# Patient Record
Sex: Female | Born: 1935 | Race: White | Hispanic: No | Marital: Single | Smoking: Never smoker
Health system: Southern US, Community
[De-identification: ages and names within clinical notes are randomized; demographics above are authoritative.]

## PROBLEM LIST (undated history)

## (undated) DIAGNOSIS — M810 Age-related osteoporosis without current pathological fracture: Secondary | ICD-10-CM

## (undated) DIAGNOSIS — M1990 Unspecified osteoarthritis, unspecified site: Secondary | ICD-10-CM

## (undated) DIAGNOSIS — F0390 Unspecified dementia without behavioral disturbance: Secondary | ICD-10-CM

## (undated) DIAGNOSIS — I10 Essential (primary) hypertension: Secondary | ICD-10-CM

## (undated) DIAGNOSIS — F329 Major depressive disorder, single episode, unspecified: Secondary | ICD-10-CM

## (undated) DIAGNOSIS — F32A Depression, unspecified: Secondary | ICD-10-CM

## (undated) DIAGNOSIS — I509 Heart failure, unspecified: Secondary | ICD-10-CM

## (undated) HISTORY — PX: CHOLECYSTECTOMY: SHX55

## (undated) HISTORY — PX: TOTAL HIP ARTHROPLASTY: SHX124

## (undated) HISTORY — PX: CATARACT EXTRACTION: SUR2

## (undated) HISTORY — PX: APPENDECTOMY: SHX54

---

## 2014-01-19 ENCOUNTER — Emergency Department: Payer: Self-pay | Admitting: Emergency Medicine

## 2014-01-19 LAB — CBC
HCT: 27.5 % — ABNORMAL LOW (ref 35.0–47.0)
HGB: 8.5 g/dL — ABNORMAL LOW (ref 12.0–16.0)
MCH: 24.4 pg — AB (ref 26.0–34.0)
MCHC: 30.8 g/dL — ABNORMAL LOW (ref 32.0–36.0)
MCV: 79 fL — ABNORMAL LOW (ref 80–100)
Platelet: 125 10*3/uL — ABNORMAL LOW (ref 150–440)
RBC: 3.48 10*6/uL — ABNORMAL LOW (ref 3.80–5.20)
RDW: 17.8 % — AB (ref 11.5–14.5)
WBC: 9.4 10*3/uL (ref 3.6–11.0)

## 2014-01-19 LAB — BASIC METABOLIC PANEL
Anion Gap: 3 — ABNORMAL LOW (ref 7–16)
BUN: 26 mg/dL — ABNORMAL HIGH (ref 7–18)
CALCIUM: 9.7 mg/dL (ref 8.5–10.1)
CHLORIDE: 112 mmol/L — AB (ref 98–107)
CREATININE: 1.21 mg/dL (ref 0.60–1.30)
Co2: 23 mmol/L (ref 21–32)
EGFR (African American): 46 — ABNORMAL LOW
GFR CALC NON AF AMER: 40 — AB
Glucose: 82 mg/dL (ref 65–99)
OSMOLALITY: 280 (ref 275–301)
Potassium: 5 mmol/L (ref 3.5–5.1)
SODIUM: 138 mmol/L (ref 136–145)

## 2014-01-19 LAB — TROPONIN I
Troponin-I: <0.02 ng/mL
Troponin-I: <0.02 ng/mL

## 2016-04-05 ENCOUNTER — Encounter: Payer: Self-pay | Admitting: Emergency Medicine

## 2016-04-05 ENCOUNTER — Emergency Department: Payer: Medicare Other

## 2016-04-05 ENCOUNTER — Emergency Department
Admission: EM | Admit: 2016-04-05 | Discharge: 2016-04-06 | Disposition: A | Discharge status: Home / Self Care | Payer: Medicare Other | Attending: Emergency Medicine | Admitting: Emergency Medicine

## 2016-04-05 DIAGNOSIS — I110 Hypertensive heart disease with heart failure: Secondary | ICD-10-CM | POA: Insufficient documentation

## 2016-04-05 DIAGNOSIS — K8689 Other specified diseases of pancreas: Secondary | ICD-10-CM | POA: Diagnosis not present

## 2016-04-05 DIAGNOSIS — K449 Diaphragmatic hernia without obstruction or gangrene: Secondary | ICD-10-CM

## 2016-04-05 DIAGNOSIS — R0602 Shortness of breath: Secondary | ICD-10-CM | POA: Diagnosis present

## 2016-04-05 DIAGNOSIS — K869 Disease of pancreas, unspecified: Secondary | ICD-10-CM

## 2016-04-05 DIAGNOSIS — I509 Heart failure, unspecified: Secondary | ICD-10-CM | POA: Insufficient documentation

## 2016-04-05 DIAGNOSIS — F329 Major depressive disorder, single episode, unspecified: Secondary | ICD-10-CM | POA: Diagnosis not present

## 2016-04-05 DIAGNOSIS — R0600 Dyspnea, unspecified: Secondary | ICD-10-CM | POA: Diagnosis not present

## 2016-04-05 DIAGNOSIS — M810 Age-related osteoporosis without current pathological fracture: Secondary | ICD-10-CM | POA: Insufficient documentation

## 2016-04-05 DIAGNOSIS — Z79899 Other long term (current) drug therapy: Secondary | ICD-10-CM | POA: Insufficient documentation

## 2016-04-05 HISTORY — DX: Unspecified dementia, unspecified severity, without behavioral disturbance, psychotic disturbance, mood disturbance, and anxiety: F03.90

## 2016-04-05 HISTORY — DX: Depression, unspecified: F32.A

## 2016-04-05 HISTORY — DX: Unspecified osteoarthritis, unspecified site: M19.90

## 2016-04-05 HISTORY — DX: Essential (primary) hypertension: I10

## 2016-04-05 HISTORY — DX: Heart failure, unspecified: I50.9

## 2016-04-05 HISTORY — DX: Age-related osteoporosis without current pathological fracture: M81.0

## 2016-04-05 HISTORY — DX: Major depressive disorder, single episode, unspecified: F32.9

## 2016-04-05 LAB — CBC WITH DIFFERENTIAL/PLATELET
BASOS ABS: 0.1 10*3/uL (ref 0–0.1)
BASOS PCT: 1 %
EOS ABS: 0.4 10*3/uL (ref 0–0.7)
Eosinophils Relative: 6 %
HCT: 38.4 % (ref 35.0–47.0)
HEMOGLOBIN: 12.6 g/dL (ref 12.0–16.0)
LYMPHS ABS: 1.7 10*3/uL (ref 1.0–3.6)
Lymphocytes Relative: 23 %
MCH: 31.4 pg (ref 26.0–34.0)
MCHC: 32.8 g/dL (ref 32.0–36.0)
MCV: 95.8 fL (ref 80.0–100.0)
Monocytes Absolute: 0.9 10*3/uL (ref 0.2–0.9)
Monocytes Relative: 12 %
NEUTROS PCT: 58 %
Neutro Abs: 4.2 10*3/uL (ref 1.4–6.5)
Platelets: 205 10*3/uL (ref 150–440)
RBC: 4.01 MIL/uL (ref 3.80–5.20)
RDW: 13.9 % (ref 11.5–14.5)
WBC: 7.3 10*3/uL (ref 3.6–11.0)

## 2016-04-05 LAB — URINALYSIS COMPLETE WITH MICROSCOPIC (ARMC ONLY)
BILIRUBIN URINE: NEGATIVE
GLUCOSE, UA: NEGATIVE mg/dL
HGB URINE DIPSTICK: NEGATIVE
Ketones, ur: NEGATIVE mg/dL
NITRITE: NEGATIVE
Protein, ur: NEGATIVE mg/dL
SPECIFIC GRAVITY, URINE: 1.019 (ref 1.005–1.030)
pH: 5 (ref 5.0–8.0)

## 2016-04-05 LAB — TROPONIN I

## 2016-04-05 LAB — BASIC METABOLIC PANEL
ANION GAP: 6 (ref 5–15)
BUN: 25 mg/dL — ABNORMAL HIGH (ref 6–20)
CHLORIDE: 108 mmol/L (ref 101–111)
CO2: 27 mmol/L (ref 22–32)
Calcium: 9.9 mg/dL (ref 8.9–10.3)
Creatinine, Ser: 1.22 mg/dL — ABNORMAL HIGH (ref 0.44–1.00)
GFR, EST AFRICAN AMERICAN: 44 mL/min — AB (ref >60)
GFR, EST NON AFRICAN AMERICAN: 38 mL/min — AB (ref >60)
Glucose, Bld: 95 mg/dL (ref 65–99)
POTASSIUM: 3.9 mmol/L (ref 3.5–5.1)
SODIUM: 141 mmol/L (ref 135–145)

## 2016-04-05 LAB — BRAIN NATRIURETIC PEPTIDE: B NATRIURETIC PEPTIDE 5: 312 pg/mL — AB (ref 0.0–100.0)

## 2016-04-05 MED ORDER — IOPAMIDOL (ISOVUE-300) INJECTION 61%
80.0000 mL | Freq: Once | INTRAVENOUS | Status: AC | PRN
  Administered 2016-04-05: 80 mL via INTRAVENOUS

## 2016-04-05 MED ORDER — DIATRIZOATE MEGLUMINE & SODIUM 66-10 % PO SOLN
15.0000 mL | Freq: Once | ORAL | Status: AC
  Administered 2016-04-05: 15 mL via ORAL

## 2016-04-05 MED ORDER — OMEPRAZOLE 40 MG PO CPDR: 40.0000 mg | DELAYED_RELEASE_CAPSULE | Freq: Every day | ORAL | Status: DC

## 2016-04-05 NOTE — ED Notes (Addendum)
[facility]. Staff stated she was wheezing and they gave her, her albuterol inhaler. Lungs clear on arrival. Patient c/o epigastric pain and back pain x4 days. Patient states, "it feels tight." Patient A&O x3, disoriented to time.

## 2016-04-05 NOTE — ED Notes (Signed)
Assisted pt to restroom. Pt walked to toilet with minimal assistance. Pt had loose BM. Reports has not had any loose BMs prior to today.

## 2016-04-05 NOTE — Discharge Instructions (Signed)
Hiatal Hernia °A hiatal hernia occurs when part of your stomach slides above the muscle that separates your abdomen from your chest (diaphragm). You can be born with a hiatal hernia (congenital), or it may develop over time. In almost all cases of hiatal hernia, only the top part of the stomach pushes through.  °Many people have a hiatal hernia with no symptoms. The larger the hernia, the more likely that you will have symptoms. In some cases, a hiatal hernia allows stomach acid to flow back into the tube that carries food from your mouth to your stomach (esophagus). This may cause heartburn symptoms. Severe heartburn symptoms may mean you have developed a condition called gastroesophageal reflux disease (GERD).  °CAUSES  °Hiatal hernias are caused by a weakness in the opening (hiatus) where your esophagus passes through your diaphragm to attach to the upper part of your stomach. You may be born with a weakness in your hiatus, or a weakness can develop. °RISK FACTORS °Older age is a major risk factor for a hiatal hernia. Anything that increases pressure on your diaphragm can also increase your risk of a hiatal hernia. This includes: °· Pregnancy. °· Excess weight. °· Frequent constipation. °SIGNS AND SYMPTOMS  °People with a hiatal hernia often have no symptoms. If symptoms develop, they are almost always caused by GERD. They may include: °· Heartburn. °· Belching. °· Indigestion. °· Trouble swallowing. °· Coughing or wheezing.  °· Sore throat. °· Hoarseness. °· Chest pain. °DIAGNOSIS  °A hiatal hernia is sometimes found during an exam for another problem. Your health care provider may suspect a hiatal hernia if you have symptoms of GERD. Tests may be done to diagnose GERD. These may include: °· X-rays of your stomach or chest. °· An upper gastrointestinal (GI) series. This is an X-ray exam of your GI tract involving the use of a chalky liquid that you swallow. The liquid shows up clearly on the X-ray. °· Endoscopy.  This is a procedure to look into your stomach using a thin, flexible tube that has a tiny camera and light on the end of it. °TREATMENT  °If you have no symptoms, you may not need treatment. If you have symptoms, treatment may include: °· Dietary and lifestyle changes to help reduce GERD symptoms. °· Medicines. These may include: °· Over-the-counter antacids. °· Medicines that make your stomach empty more quickly. °· Medicines that block the production of stomach acid (H2 blockers). °· Stronger medicines to reduce stomach acid (proton pump inhibitors). °· You may need surgery to repair the hernia if other treatments are not helping. °HOME CARE INSTRUCTIONS  °· Take all medicines as directed by your health care provider. °· Quit smoking, if you smoke. °· Try to achieve and maintain a healthy body weight. °· Eat frequent small meals instead of three large meals a day. This keeps your stomach from getting too full. °· Eat slowly. °· Do not lie down right after eating.  °· Do not eat 1-2 hours before bed.   °· Do not drink beverages with caffeine. These include cola, coffee, cocoa, and tea. °· Do not drink alcohol. °· Avoid foods that can make symptoms of GERD worse. These may include: °· Fatty foods. °· Citrus fruits. °· Other foods and drinks that contain acid. °· Avoid putting pressure on your belly. Anything that puts pressure on your belly increases the amount of acid that may be pushed up into your esophagus.   °· Avoid bending over, especially after eating. °· Raise the head of your bed   by putting blocks under the legs. This keeps your head and esophagus higher than your stomach. °· Do not wear tight clothing around your chest or stomach. °· Try not to strain when having a bowel movement, when urinating, or when lifting heavy objects. °SEEK MEDICAL CARE IF: °· Your symptoms are not controlled with medicines or lifestyle changes. °· You are having trouble swallowing. °· You have coughing or wheezing that will not  go away. °SEEK IMMEDIATE MEDICAL CARE IF: °· Your pain is getting worse. °· Your pain spreads to your arms, neck, jaw, teeth, or back. °· You have shortness of breath. °· You sweat for no reason. °· You feel sick to your stomach (nauseous) or vomit. °· You vomit blood. °· You have bright red blood in your stools. °· You have black, tarry stools.   °  °This information is not intended to replace advice given to you by your health care provider. Make sure you discuss any questions you have with your health care provider. °  °Document Released: 02/18/2004 Document Revised: 12/19/2014 Document Reviewed: 11/15/2013 °Elsevier Interactive Patient Education ©2016 [employer] ° °Shortness of Breath °Shortness of breath means you have trouble breathing. It could also mean that you have a medical problem. You should get immediate medical care for shortness of breath. °CAUSES  °· Not enough oxygen in the air such as with high altitudes or a smoke-filled room. °· Certain lung diseases, infections, or problems. °· Heart disease or conditions, such as angina or heart failure. °· Low red blood cells (anemia). °· Poor physical fitness, which can cause shortness of breath when you exercise. °· Chest or back injuries or stiffness. °· Being overweight. °· Smoking. °· Anxiety, which can make you feel like you are not getting enough air. °DIAGNOSIS  °Serious medical problems can often be found during your physical exam. Tests may also be done to determine why you are having shortness of breath. Tests may include: °· Chest X-rays. °· Lung function tests. °· Blood tests. °· An electrocardiogram (ECG). °· An ambulatory electrocardiogram. An ambulatory ECG records your heartbeat patterns over a 24-hour period. °· Exercise testing. °· A transthoracic echocardiogram (TTE). During echocardiography, sound waves are used to evaluate how blood flows through your heart. °· A transesophageal echocardiogram (TEE). °· Imaging scans. °Your health  care provider may not be able to find a cause for your shortness of breath after your exam. In this case, it is important to have a follow-up exam with your health care provider as directed.  °TREATMENT  °Treatment for shortness of breath depends on the cause of your symptoms and can vary greatly. °HOME CARE INSTRUCTIONS  °· Do not smoke. Smoking is a common cause of shortness of breath. If you smoke, ask for help to quit. °· Avoid being around chemicals or things that may bother your breathing, such as paint fumes and dust. °· Rest as needed. Slowly resume your usual activities. °· If medicines were prescribed, take them as directed for the full length of time directed. This includes oxygen and any inhaled medicines. °· Keep all follow-up appointments as directed by your health care provider. °SEEK MEDICAL CARE IF:  °· Your condition does not improve in the time expected. °· You have a hard time doing your normal activities even with rest. °· You have any new symptoms. °SEEK IMMEDIATE MEDICAL CARE IF:  °· Your shortness of breath gets worse. °· You feel light-headed, faint, or develop a cough not controlled with medicines. °· You   start coughing up blood. °· You have pain with breathing. °· You have chest pain or pain in your arms, shoulders, or abdomen. °· You have a fever. °· You are unable to walk up stairs or exercise the way you normally do. °MAKE SURE YOU: °· Understand these instructions. °· Will watch your condition. °· Will get help right away if you are not doing well or get worse. °  °This information is not intended to replace advice given to you by your health care provider. Make sure you discuss any questions you have with your health care provider. °  °Document Released: 08/23/2001 Document Revised: 12/03/2013 Document Reviewed: 02/13/2012 °Elsevier Interactive Patient Education ©2016 Elsevier Inc. ° °

## 2016-04-05 NOTE — ED Provider Notes (Signed)
[facility] Emergency Department Provider Note     Time seen: ----------------------------------------- 7:58 PM on 04/05/2016 -----------------------------------------    I have reviewed the triage vital signs and the nursing notes.   HISTORY  Chief Complaint Shortness of Breath    HPI Crystal Barrera is a 82 y.o. female who presents from the nursing home for shortness of breath and abdominal pain. Staff states she was wheezing and gave her an albuterol inhaler. She rales with clear lungs but complains of epigastric pain and back pain for last 4 days. Patient states just feels tight, nothing seems to make it better or worse. Son states she has not had a history of that, she does have a when necessary inhaler to use at the nursing home.   Past Medical History  Diagnosis Date  . Dementia   . Arthritis   . Osteoporosis   . Depression   . Hypertension     There are no active problems to display for this patient.   Past Surgical History  Procedure Laterality Date  . Total hip arthroplasty    . Cholecystectomy    . Cataract extraction    . Appendectomy      Allergies Review of patient's allergies indicates no known allergies.  Social History Social History  Substance Use Topics  . Smoking status: Never Smoker   . Smokeless tobacco: None  . Alcohol Use: No    Review of Systems Constitutional: Negative for fever. Eyes: Negative for visual changes. ENT: Negative for sore throat. Cardiovascular: Negative for chest pain. Respiratory: Positive for shortness of breath Gastrointestinal: Positive for abdominal pain, negative for vomiting and diarrhea Genitourinary: Negative for dysuria. Musculoskeletal: Positive for back pain Skin: Negative for rash. Neurological: Negative for headaches, focal weakness or numbness.  10-point ROS otherwise negative.  ____________________________________________   PHYSICAL EXAM:  VITAL SIGNS: ED Triage  Vitals  Enc Vitals Group     BP 04/05/16 1952 192/66 mmHg     Pulse Rate 04/05/16 1952 65     Resp 04/05/16 1952 17     Temp 04/05/16 1952 97.5 F (36.4 C)     Temp src --      SpO2 04/05/16 1952 97 %     Weight 04/05/16 1952 130 lb (58.968 kg)     Height 04/05/16 1952  (1.6 m)     Head Cir --      Peak Flow --      Pain Score --      Pain Loc --      Pain Edu? --      Excl. in GC? --    Constitutional: Alert, Well appearing and in no distress. Eyes: Conjunctivae are normal. PERRL. Normal extraocular movements. ENT   Head: Normocephalic and atraumatic.   Nose: No congestion/rhinnorhea.   Mouth/Throat: Mucous membranes are moist.   Neck: No stridor. Cardiovascular: Normal rate, regular rhythm. No murmurs, rubs, or gallops. Respiratory: Normal respiratory effort without tachypnea nor retractions. Breath sounds are clear and equal bilaterally. Minimal end expiratory wheezing Gastrointestinal: Epigastric tenderness, no rebound or guarding. Normal bowel sounds. Musculoskeletal: Nontender with normal range of motion in all extremities. No lower extremity tenderness nor edema. Neurologic:  Normal speech and language. No gross focal neurologic deficits are appreciated.  Skin:  Skin is warm, dry and intact. No rash noted. Psychiatric: Mood and affect are normal. Speech and behavior are normal.  ____________________________________________  EKG: Interpreted by me. Sinus rhythm with a rate of 57 bpm, normal  PR interval, normal QRS, normal QT interval. Normal axis.  ____________________________________________  ED COURSE:  Pertinent labs & imaging results that were available during my care of the patient were reviewed by me and considered in my medical decision making (see chart for details). Patient is in no acute distress, will check basic labs and imaging. ____________________________________________    LABS (pertinent positives/negatives)  Labs Reviewed  BASIC  METABOLIC PANEL - Abnormal; Notable for the following:    BUN 25 (*)    Creatinine, Ser 1.22 (*)    GFR calc non Af Amer 38 (*)    GFR calc Af Amer 44 (*)    All other components within normal limits  BRAIN NATRIURETIC PEPTIDE - Abnormal; Notable for the following:    B Natriuretic Peptide 312.0 (*)    All other components within normal limits  URINALYSIS COMPLETEWITH MICROSCOPIC ([facility] ONLY) - Abnormal; Notable for the following:    Color, Urine YELLOW (*)    APPearance CLEAR (*)    Leukocytes, UA TRACE (*)    Bacteria, UA RARE (*)    Squamous Epithelial / LPF 0-5 (*)    All other components within normal limits  CBC WITH DIFFERENTIAL/PLATELET  TROPONIN I  HEPATIC FUNCTION PANEL  LIPASE, BLOOD    RADIOLOGY Images were viewed by me  Chest x-ray, CT abdomen and pelvis CLINICAL DATA: Dyspnea. Diffuse abdominal pain. Epigastric and back pain for 4 days.  EXAM: PORTABLE CHEST 1 VIEW  COMPARISON: Chest radiograph 01/19/2014  FINDINGS: Cardiomegaly and mediastinal contours are unchanged. Atherosclerosis of the aortic arch. Questionable retrocardiac hiatal hernia. No pulmonary edema, confluent airspace disease, large pleural effusion or pneumothorax. The bones appear under mineralized.  IMPRESSION: 1. Unchanged cardiomegaly. No congestive failure. 2. Questionable retrocardiac hiatal hernia.  IMPRESSION: 1. Heterogeneous density of the pancreatic head, unclear whether this represents fatty infiltration, atrophy, or underlying pancreatic lesion. There is no peripancreatic inflammation or ductal dilatation. MRCP could be considered for evaluation, only when patient is able tolerate breath hold technique. 2. Moderate to large hiatal hernia, greater than 50% of the stomach is intrathoracic. No volvulus or complication. 3. Diffuse colonic diverticulosis, no diverticulitis. 4. Scarring and atrophy of the upper left  kidney. ____________________________________________  FINAL ASSESSMENT AND PLAN  Dyspnea, abdominal pain, pancreatic head lesion, hiatal hernia  Plan: Patient with labs and imaging as dictated above. Patient resents to ER with some dyspnea which has resolved and some abdominal pain which has resolved. She appears to possibly have a pancreatic head lesion and she'll be referred to GI for follow-up. It is unclear if this will be addressed or not given her age. I will ensure she is on antacids and she is stable for outpatient follow-up.   Emily FilbertWilliams, Jonathan E, MD   Emily FilbertJonathan E Williams, MD 04/05/16 [phone]

## 2016-04-06 LAB — HEPATIC FUNCTION PANEL
ALK PHOS: 94 U/L (ref 38–126)
ALT: 11 U/L — AB (ref 14–54)
AST: 19 U/L (ref 15–41)
Albumin: 3.7 g/dL (ref 3.5–5.0)
BILIRUBIN TOTAL: 0.3 mg/dL (ref 0.3–1.2)
Bilirubin, Direct: <0.1 mg/dL — ABNORMAL LOW (ref 0.1–0.5)
Total Protein: 7.1 g/dL (ref 6.5–8.1)

## 2016-04-06 LAB — LIPASE, BLOOD: Lipase: 53 U/L — ABNORMAL HIGH (ref 11–51)

## 2017-02-25 ENCOUNTER — Emergency Department
Admission: EM | Admit: 2017-02-25 | Discharge: 2017-02-25 | Disposition: A | Discharge status: Home / Self Care | Payer: Medicare Other | Attending: Emergency Medicine | Admitting: Emergency Medicine

## 2017-02-25 ENCOUNTER — Emergency Department: Payer: Medicare Other

## 2017-02-25 DIAGNOSIS — Y929 Unspecified place or not applicable: Secondary | ICD-10-CM | POA: Diagnosis not present

## 2017-02-25 DIAGNOSIS — Z79899 Other long term (current) drug therapy: Secondary | ICD-10-CM | POA: Insufficient documentation

## 2017-02-25 DIAGNOSIS — M79604 Pain in right leg: Secondary | ICD-10-CM | POA: Diagnosis not present

## 2017-02-25 DIAGNOSIS — I509 Heart failure, unspecified: Secondary | ICD-10-CM | POA: Diagnosis not present

## 2017-02-25 DIAGNOSIS — I110 Hypertensive heart disease with heart failure: Secondary | ICD-10-CM | POA: Insufficient documentation

## 2017-02-25 DIAGNOSIS — K625 Hemorrhage of anus and rectum: Secondary | ICD-10-CM | POA: Insufficient documentation

## 2017-02-25 DIAGNOSIS — Y999 Unspecified external cause status: Secondary | ICD-10-CM | POA: Insufficient documentation

## 2017-02-25 DIAGNOSIS — S8991XA Unspecified injury of right lower leg, initial encounter: Secondary | ICD-10-CM | POA: Diagnosis present

## 2017-02-25 DIAGNOSIS — Y939 Activity, unspecified: Secondary | ICD-10-CM | POA: Diagnosis not present

## 2017-02-25 DIAGNOSIS — W1839XA Other fall on same level, initial encounter: Secondary | ICD-10-CM | POA: Insufficient documentation

## 2017-02-25 DIAGNOSIS — F329 Major depressive disorder, single episode, unspecified: Secondary | ICD-10-CM | POA: Diagnosis not present

## 2017-02-25 LAB — CBC WITH DIFFERENTIAL/PLATELET
Basophils Absolute: 0.1 10*3/uL (ref 0–0.1)
Basophils Relative: 2 %
Eosinophils Absolute: 0.2 10*3/uL (ref 0–0.7)
Eosinophils Relative: 3 %
HCT: 41 % (ref 35.0–47.0)
HEMOGLOBIN: 13.6 g/dL (ref 12.0–16.0)
LYMPHS PCT: 25 %
Lymphs Abs: 1.7 10*3/uL (ref 1.0–3.6)
MCH: 32.3 pg (ref 26.0–34.0)
MCHC: 33.1 g/dL (ref 32.0–36.0)
MCV: 97.4 fL (ref 80.0–100.0)
Monocytes Absolute: 0.7 10*3/uL (ref 0.2–0.9)
Monocytes Relative: 10 %
NEUTROS PCT: 60 %
Neutro Abs: 4.2 10*3/uL (ref 1.4–6.5)
Platelets: 225 10*3/uL (ref 150–440)
RBC: 4.21 MIL/uL (ref 3.80–5.20)
RDW: 15.1 % — ABNORMAL HIGH (ref 11.5–14.5)
WBC: 6.9 10*3/uL (ref 3.6–11.0)

## 2017-02-25 NOTE — ED Notes (Signed)

## 2017-02-25 NOTE — ED Triage Notes (Signed)
[facility]. Per EMS pt fell 3 days ago, and had been c/o generalized body pain especially right leg pain. Pt alert and in no acute distress at this time.

## 2017-02-25 NOTE — ED Notes (Signed)
Attempted IV placement x2 by this RN and x2 by 2nd RN. All attempts unsuccessful. Blood work obtained and sent to lab.

## 2017-02-25 NOTE — ED Notes (Signed)
Per family member, pt noted to have bright red bleeding in stool and on pad. Assisted PA with rectal exam. No hemorrhoids noted per PA. HEMOCCULT POSITIVE. Pt tolerated well.

## 2017-02-25 NOTE — ED Provider Notes (Signed)
[facility] Emergency Department Provider Note   ____________________________________________   None    (approximate)  I have reviewed the triage vital signs and the nursing notes.   HISTORY  Chief Complaint Fall    HPI Crystal Barrera is a 83 y.o. female patient complain of right upper and lower extremity pain secondary to a fall 3 days ago. Patient stated fall from a standing position but she just lost balance when turning. Patient denies LOC or head injury. Patient mental condition and hearing loss limited getting  a good history. Patient arrived via EMS from nursing home.  Past Medical History:  Diagnosis Date  . Arthritis   . CHF (congestive heart failure) (HCC)   . Dementia   . Depression   . Hypertension   . Osteoporosis     There are no active problems to display for this patient.   Past Surgical History:  Procedure Laterality Date  . APPENDECTOMY    . CATARACT EXTRACTION    . CHOLECYSTECTOMY    . TOTAL HIP ARTHROPLASTY      Prior to Admission medications   Medication Sig Start Date End Date Taking? Authorizing Provider  acetaminophen (TYLENOL) 325 MG tablet Take 650 mg by mouth 3 (three) times daily.    Historical Provider, MD  albuterol (PROVENTIL HFA;VENTOLIN HFA) 108 (90 Base) MCG/ACT inhaler Inhale 1 puff into the lungs 2 (two) times daily. Pt also uses twice daily as needed for shortness of breath/wheezing.    Historical Provider, MD  amLODipine (NORVASC) 5 MG tablet Take 5 mg by mouth daily.    Historical Provider, MD  Calcium Citrate-Vitamin D 315-250 MG-UNIT TABS Take 2 tablets by mouth daily.    Historical Provider, MD  donepezil (ARICEPT) 10 MG tablet Take 10 mg by mouth at bedtime.    Historical Provider, MD  fluticasone (FLONASE) 50 MCG/ACT nasal spray Place 2 sprays into both nostrils daily.    Historical Provider, MD  furosemide (LASIX) 20 MG tablet Take 20 mg by mouth daily.    Historical Provider, MD  gabapentin  (NEURONTIN) 100 MG capsule Take 100 mg by mouth at bedtime.    Historical Provider, MD  glucosamine-chondroitin 500-400 MG tablet Take 1 tablet by mouth daily.    Historical Provider, MD  hydroxypropyl methylcellulose / hypromellose (ISOPTO TEARS / GONIOVISC) 2.5 % ophthalmic solution Place 2 drops into both eyes 4 (four) times daily.    Historical Provider, MD  iron polysaccharides (NIFEREX) 150 MG capsule Take 150 mg by mouth 3 (three) times daily.    Historical Provider, MD  Melatonin 5 MG TABS Take 5 mg by mouth at bedtime.    Historical Provider, MD  memantine (NAMENDA) 10 MG tablet Take 10 mg by mouth 2 (two) times daily.    Historical Provider, MD  Menthol, Topical Analgesic, (ICY HOT EX) Apply 1 application topically 2 (two) times daily. Pt applies to shoulders and knees.    Historical Provider, MD  metoprolol tartrate (LOPRESSOR) 25 MG tablet Take 25 mg by mouth 2 (two) times daily.    Historical Provider, MD  nitroGLYCERIN (NITROSTAT) 0.4 MG SL tablet Place 0.4 mg under the tongue every 5 (five) minutes as needed for chest pain.    Historical Provider, MD  omega-3 acid ethyl esters (LOVAZA) 1 g capsule Take 1 g by mouth daily.    Historical Provider, MD  omeprazole (PRILOSEC) 40 MG capsule Take 1 capsule (40 mg total) by mouth daily. 04/05/16 04/05/17  Emily Filbert,  MD  pravastatin (PRAVACHOL) 10 MG tablet Take 10 mg by mouth daily.    Historical Provider, MD  sertraline (ZOLOFT) 50 MG tablet Take 50 mg by mouth daily.    Historical Provider, MD  traMADol (ULTRAM) 50 MG tablet Take 50 mg by mouth 2 (two) times daily.    Historical Provider, MD    Allergies Patient has no known allergies.  Family History  Problem Relation Age of Onset  . Family history unknown: Yes    Social History Social History  Substance Use Topics  . Smoking status: Never Smoker  . Smokeless tobacco: Never Used  . Alcohol use No    Review of Systems Constitutional: No fever/chills Eyes: No visual  changes. ENT: No sore throat. Cardiovascular: Denies chest pain. Respiratory: Denies shortness of breath. Gastrointestinal: No abdominal pain.  No nausea, no vomiting.  No diarrhea.  No constipation. Genitourinary: Negative for dysuria. Musculoskeletal:Right lower extremity pain Skin: Negative for rash. Neurological: Negative for headaches, focal weakness or numbness. Psychiatric:Dementia and depression Endocrine:Hypertension   ____________________________________________   PHYSICAL EXAM:  VITAL SIGNS: ED Triage Vitals  Enc Vitals Group     BP 02/25/17 1151 (!) 180/54     Pulse Rate 02/25/17 1151 65     Resp 02/25/17 1151 18     Temp 02/25/17 1151 97.5 F (36.4 C)     Temp Source 02/25/17 1151 Oral     SpO2 02/25/17 1151 100 %     Weight 02/25/17 1152 127 lb 8 oz (57.8 kg)     Height 02/25/17 1152 5' (1.524 m)     Head Circumference --      Peak Flow --      Pain Score --      Pain Loc --      Pain Edu? --      Excl. in GC? --     Constitutional: Alert and oriented. Well appearing and in no acute distress. Eyes: Conjunctivae are normal. PERRL. EOMI. Head: Atraumatic. Nose: No congestion/rhinnorhea. Mouth/Throat: Mucous membranes are moist.  Oropharynx non-erythematous. Neck: No stridor.  No cervical spine tenderness to palpation. Hematological/Lymphatic/Immunilogical: No cervical lymphadenopathy. Cardiovascular: Normal rate, regular rhythm. Grossly normal heart sounds.  Good peripheral circulation. Elevated blood pressure Respiratory: Normal respiratory effort.  No retractions. Lungs CTAB. Gastrointestinal: Soft and nontender. No distention. No abdominal bruits. No CVA tenderness. Musculoskeletal: No obvious lower extremity deformity. No leg length discrepancy. Patient has diffuse guarding from the ankle to the right hip. Neurologic:  Normal speech and language. No gross focal neurologic deficits are appreciated. No gait instability. Skin:  Skin is warm, dry and  intact. No rash noted. Psychiatric: Mood and affect are normal. Speech and behavior are normal.  ____________________________________________   LABS (all labs ordered are listed, but only abnormal results are displayed)  Labs Reviewed  CBC WITH DIFFERENTIAL/PLATELET - Abnormal; Notable for the following:       Result Value   RDW 15.1 (*)    All other components within normal limits  BASIC METABOLIC PANEL  BASIC METABOLIC PANEL  TYPE AND SCREEN  TYPE AND SCREEN   ____________________________________________  EKG   ____________________________________________  RADIOLOGY  X-ray of the right hip, right tib-fib, and right ankle reveals no fracture or other acute findings. ____________________________________________   PROCEDURES  Procedure(s) performed: None  Procedures  Critical Care performed: No  ____________________________________________   INITIAL IMPRESSION / ASSESSMENT AND PLAN / ED COURSE  Pertinent labs & imaging results that were available during my care of  the patient were reviewed by me and considered in my medical decision making (see chart for details).  Musculoskeletal pain secondary to fall. Discussed x-ray findings with patient. He has been brought to the attention of the patient went to the bathroom to urinate and was found to be blood on her pad. Patient underwent a rectal exam which showed a she is guaiac positive. No palpable stool or masses noted. No noticeable hemorrhaging. Patient labs unremarkable. Patient advised via CNA to monitor condition return back immediately if there is bright red blood in the follow-up with her treating doctor in 2 days.    ____________________________________________   FINAL CLINICAL IMPRESSION(S) / ED DIAGNOSES  Final diagnoses:  Musculoskeletal pain of lower extremity, right  Rectal bleeding      NEW MEDICATIONS STARTED DURING THIS VISIT:  New Prescriptions   No medications on file     Note:  This  document was prepared using Dragon voice recognition software and may include unintentional dictation errors.    Joni Reiningonald K Raza Bayless, PA-C 02/25/17 1251    Joni Reiningonald K Maxene Byington, PA-C 02/25/17 1418    Joni Reiningonald K Cregg Jutte, PA-C 02/25/17 1419    Myrna Blazeravid Matthew Schaevitz, MD 02/25/17 [phone]

## 2017-02-25 NOTE — ED Notes (Signed)
Per PA, okay to forgo re-drawing BMP/Type & Screen d/t pt is difficult stick and is up for discharge at this time.

## 2017-02-25 NOTE — ED Notes (Signed)
Patient transported to X-ray 

## 2017-03-04 ENCOUNTER — Inpatient Hospital Stay
Admission: EM | Admit: 2017-03-04 | Discharge: 2017-03-06 | DRG: 394 | Disposition: A | Discharge status: Hospice | Payer: Medicare Other | Attending: Internal Medicine | Admitting: Internal Medicine

## 2017-03-04 ENCOUNTER — Emergency Department: Payer: Medicare Other

## 2017-03-04 DIAGNOSIS — Z66 Do not resuscitate: Secondary | ICD-10-CM | POA: Diagnosis present

## 2017-03-04 DIAGNOSIS — N390 Urinary tract infection, site not specified: Secondary | ICD-10-CM | POA: Diagnosis present

## 2017-03-04 DIAGNOSIS — K631 Perforation of intestine (nontraumatic): Principal | ICD-10-CM | POA: Diagnosis present

## 2017-03-04 DIAGNOSIS — Z515 Encounter for palliative care: Secondary | ICD-10-CM | POA: Diagnosis present

## 2017-03-04 DIAGNOSIS — I130 Hypertensive heart and chronic kidney disease with heart failure and stage 1 through stage 4 chronic kidney disease, or unspecified chronic kidney disease: Secondary | ICD-10-CM | POA: Diagnosis present

## 2017-03-04 DIAGNOSIS — Z9049 Acquired absence of other specified parts of digestive tract: Secondary | ICD-10-CM

## 2017-03-04 DIAGNOSIS — Z791 Long term (current) use of non-steroidal anti-inflammatories (NSAID): Secondary | ICD-10-CM

## 2017-03-04 DIAGNOSIS — N179 Acute kidney failure, unspecified: Secondary | ICD-10-CM | POA: Diagnosis present

## 2017-03-04 DIAGNOSIS — F0390 Unspecified dementia without behavioral disturbance: Secondary | ICD-10-CM | POA: Diagnosis present

## 2017-03-04 DIAGNOSIS — M1990 Unspecified osteoarthritis, unspecified site: Secondary | ICD-10-CM | POA: Diagnosis present

## 2017-03-04 DIAGNOSIS — N185 Chronic kidney disease, stage 5: Secondary | ICD-10-CM | POA: Diagnosis present

## 2017-03-04 DIAGNOSIS — I509 Heart failure, unspecified: Secondary | ICD-10-CM | POA: Diagnosis present

## 2017-03-04 DIAGNOSIS — Z7189 Other specified counseling: Secondary | ICD-10-CM | POA: Diagnosis not present

## 2017-03-04 DIAGNOSIS — M810 Age-related osteoporosis without current pathological fracture: Secondary | ICD-10-CM | POA: Diagnosis present

## 2017-03-04 DIAGNOSIS — Z79899 Other long term (current) drug therapy: Secondary | ICD-10-CM | POA: Diagnosis not present

## 2017-03-04 DIAGNOSIS — F329 Major depressive disorder, single episode, unspecified: Secondary | ICD-10-CM | POA: Diagnosis present

## 2017-03-04 DIAGNOSIS — Z7951 Long term (current) use of inhaled steroids: Secondary | ICD-10-CM | POA: Diagnosis not present

## 2017-03-04 DIAGNOSIS — Z96649 Presence of unspecified artificial hip joint: Secondary | ICD-10-CM | POA: Diagnosis present

## 2017-03-04 LAB — URINALYSIS, COMPLETE (UACMP) WITH MICROSCOPIC
BILIRUBIN URINE: NEGATIVE
GLUCOSE, UA: NEGATIVE mg/dL
Ketones, ur: NEGATIVE mg/dL
NITRITE: POSITIVE — AB
PROTEIN: NEGATIVE mg/dL
Specific Gravity, Urine: 1.019 (ref 1.005–1.030)
pH: 5 (ref 5.0–8.0)

## 2017-03-04 LAB — BASIC METABOLIC PANEL
Anion gap: 9 (ref 5–15)
BUN: 59 mg/dL — AB (ref 6–20)
CHLORIDE: 106 mmol/L (ref 101–111)
CO2: 21 mmol/L — AB (ref 22–32)
CREATININE: 2.53 mg/dL — AB (ref 0.44–1.00)
Calcium: 10.1 mg/dL (ref 8.9–10.3)
GFR calc Af Amer: 18 mL/min — ABNORMAL LOW (ref >60)
GFR calc non Af Amer: 15 mL/min — ABNORMAL LOW (ref >60)
GLUCOSE: 120 mg/dL — AB (ref 65–99)
POTASSIUM: 4.4 mmol/L (ref 3.5–5.1)
SODIUM: 136 mmol/L (ref 135–145)

## 2017-03-04 LAB — CBC
HCT: 37.8 % (ref 35.0–47.0)
HEMOGLOBIN: 12.3 g/dL (ref 12.0–16.0)
MCH: 31.9 pg (ref 26.0–34.0)
MCHC: 32.5 g/dL (ref 32.0–36.0)
MCV: 98.3 fL (ref 80.0–100.0)
Platelets: 209 10*3/uL (ref 150–440)
RBC: 3.84 MIL/uL (ref 3.80–5.20)
RDW: 14.3 % (ref 11.5–14.5)
WBC: 23.2 10*3/uL — ABNORMAL HIGH (ref 3.6–11.0)

## 2017-03-04 LAB — DIFFERENTIAL
Basophils Absolute: 0 10*3/uL (ref 0–0.1)
Basophils Relative: 0 %
EOS PCT: 0 %
Eosinophils Absolute: 0 10*3/uL (ref 0–0.7)
LYMPHS ABS: 1 10*3/uL (ref 1.0–3.6)
LYMPHS PCT: 4 %
MONO ABS: 1.2 10*3/uL — AB (ref 0.2–0.9)
Monocytes Relative: 5 %
NEUTROS ABS: 20.9 10*3/uL — AB (ref 1.4–6.5)
Neutrophils Relative %: 91 %

## 2017-03-04 LAB — BRAIN NATRIURETIC PEPTIDE: B Natriuretic Peptide: 350 pg/mL — ABNORMAL HIGH (ref 0.0–100.0)

## 2017-03-04 LAB — INFLUENZA PANEL BY PCR (TYPE A & B)
INFLAPCR: NEGATIVE
Influenza B By PCR: NEGATIVE

## 2017-03-04 LAB — ALBUMIN: Albumin: 3.3 g/dL — ABNORMAL LOW (ref 3.5–5.0)

## 2017-03-04 LAB — LIPASE, BLOOD: LIPASE: 12 U/L (ref 11–51)

## 2017-03-04 LAB — PROTEIN, TOTAL: Total Protein: 6.9 g/dL (ref 6.5–8.1)

## 2017-03-04 LAB — AST: AST: 19 U/L (ref 15–41)

## 2017-03-04 LAB — TROPONIN I: Troponin I: 0.04 ng/mL (ref <0.03)

## 2017-03-04 LAB — ALKALINE PHOSPHATASE: Alkaline Phosphatase: 92 U/L (ref 38–126)

## 2017-03-04 LAB — ALT: ALT: 10 U/L — ABNORMAL LOW (ref 14–54)

## 2017-03-04 LAB — BILIRUBIN, TOTAL: Total Bilirubin: 1 mg/dL (ref 0.3–1.2)

## 2017-03-04 LAB — LACTIC ACID, PLASMA: Lactic Acid, Venous: 1.6 mmol/L (ref 0.5–1.9)

## 2017-03-04 MED ORDER — LORAZEPAM 1 MG PO TABS: 1.0000 mg | ORAL_TABLET | ORAL | Status: DC | PRN

## 2017-03-04 MED ORDER — ACETAMINOPHEN 325 MG PO TABS: 650.0000 mg | ORAL_TABLET | Freq: Four times a day (QID) | ORAL | Status: DC | PRN

## 2017-03-04 MED ORDER — PIPERACILLIN-TAZOBACTAM 3.375 G IVPB 30 MIN
3.3750 g | Freq: Once | INTRAVENOUS | Status: DC
  Filled 2017-03-04: qty 50

## 2017-03-04 MED ORDER — GLYCOPYRROLATE 1 MG PO TABS
1.0000 mg | ORAL_TABLET | ORAL | Status: DC | PRN
Start: 2017-03-04 — End: 2017-03-06

## 2017-03-04 MED ORDER — MORPHINE SULFATE (CONCENTRATE) 10 MG/0.5ML PO SOLN
5.0000 mg | ORAL | Status: DC | PRN
  Administered 2017-03-05 – 2017-03-06 (×3): 5 mg via ORAL
  Filled 2017-03-04 (×3): qty 1

## 2017-03-04 MED ORDER — GLYCOPYRROLATE 0.2 MG/ML IJ SOLN
0.2000 mg | INTRAMUSCULAR | Status: DC | PRN
Start: 2017-03-04 — End: 2017-03-06
  Filled 2017-03-04: qty 1

## 2017-03-04 MED ORDER — LORAZEPAM 2 MG/ML PO CONC
1.0000 mg | ORAL | Status: DC | PRN
  Administered 2017-03-05: 08:00:00 1 mg via SUBLINGUAL
  Filled 2017-03-04: qty 1

## 2017-03-04 MED ORDER — ONDANSETRON HCL 4 MG/2ML IJ SOLN: 4.0000 mg | Freq: Four times a day (QID) | INTRAMUSCULAR | Status: DC | PRN

## 2017-03-04 MED ORDER — ONDANSETRON 4 MG PO TBDP
4.0000 mg | ORAL_TABLET | Freq: Four times a day (QID) | ORAL | Status: DC | PRN
  Filled 2017-03-04: qty 1

## 2017-03-04 MED ORDER — BIOTENE DRY MOUTH MT LIQD: 15.0000 mL | OROMUCOSAL | Status: DC | PRN

## 2017-03-04 MED ORDER — SODIUM CHLORIDE 0.9 % IV BOLUS (SEPSIS): 1000.0000 mL | Freq: Once | INTRAVENOUS | Status: DC

## 2017-03-04 MED ORDER — POLYVINYL ALCOHOL 1.4 % OP SOLN
1.0000 [drp] | Freq: Four times a day (QID) | OPHTHALMIC | Status: DC | PRN
  Filled 2017-03-04: qty 15

## 2017-03-04 MED ORDER — ACETAMINOPHEN 650 MG RE SUPP: 650.0000 mg | Freq: Four times a day (QID) | RECTAL | Status: DC | PRN

## 2017-03-04 MED ORDER — GLYCOPYRROLATE 0.2 MG/ML IJ SOLN
0.2000 mg | INTRAMUSCULAR | Status: DC | PRN
  Filled 2017-03-04: qty 1

## 2017-03-04 MED ORDER — IOPAMIDOL (ISOVUE-300) INJECTION 61%
15.0000 mL | INTRAVENOUS | Status: AC
  Administered 2017-03-04: 15 mL via ORAL

## 2017-03-04 MED ORDER — MORPHINE SULFATE (PF) 2 MG/ML IV SOLN
1.0000 mg | INTRAVENOUS | Status: DC | PRN
  Administered 2017-03-04 – 2017-03-05 (×3): 1 mg via INTRAVENOUS
  Filled 2017-03-04 (×3): qty 1

## 2017-03-04 MED ORDER — MORPHINE SULFATE (PF) 2 MG/ML IV SOLN
2.0000 mg | Freq: Once | INTRAVENOUS | Status: AC
  Administered 2017-03-04: 2 mg via INTRAVENOUS
  Filled 2017-03-04: qty 1

## 2017-03-04 MED ORDER — MORPHINE SULFATE (CONCENTRATE) 10 MG/0.5ML PO SOLN: 5.0000 mg | ORAL | Status: DC | PRN

## 2017-03-04 MED ORDER — ONDANSETRON HCL 4 MG/2ML IJ SOLN
4.0000 mg | Freq: Once | INTRAMUSCULAR | Status: AC
  Administered 2017-03-04: 4 mg via INTRAVENOUS
  Filled 2017-03-04: qty 2

## 2017-03-04 MED ORDER — SODIUM CHLORIDE 0.9 % IV SOLN: Freq: Once | INTRAVENOUS | Status: DC

## 2017-03-04 MED ORDER — HALOPERIDOL LACTATE 5 MG/ML IJ SOLN
0.5000 mg | INTRAMUSCULAR | Status: DC | PRN
Start: 2017-03-04 — End: 2017-03-06

## 2017-03-04 MED ORDER — HALOPERIDOL 0.5 MG PO TABS
0.5000 mg | ORAL_TABLET | ORAL | Status: DC | PRN
  Filled 2017-03-04: qty 1

## 2017-03-04 MED ORDER — LORAZEPAM 2 MG/ML IJ SOLN: 1.0000 mg | INTRAMUSCULAR | Status: DC | PRN

## 2017-03-04 MED ORDER — HALOPERIDOL LACTATE 2 MG/ML PO CONC
0.5000 mg | ORAL | Status: DC | PRN
  Filled 2017-03-04: qty 0.3

## 2017-03-04 NOTE — Consult Note (Signed)
Patient ID: Crystal Barrera, female   DOB: [birth year], 83 y.o.   MRN: [id number]  HPI Crystal Barrera is a 83 y.o. female with a history of CHF, dementia and chronic renal insufficiency presented to the emergency room with diffuse and generalized pains. Specifically she reports severe abdominal pain that started yesterday. I interviewed both the patient and her son and apparently over the last few weeks to months she is being declining in her overall health. Her dementia is getting worse and she has only been able to walk for a few steps. She is currently a nursing home and apparently had a recent fall and a nursing home reported some diffuse abdominal pain. She denies any nausea and vomiting but does reports decreased appetite. The workup in the emergency room included a CT scan of the abdomen and pelvis revealing a defect in the mesenteric side of the small bowel with inflammatory changes consistent with a contained perforation. White count is 23,000 and her creatinine has bumped 2.5  HPI  Past Medical History:  Diagnosis Date  . Arthritis   . CHF (congestive heart failure) (HCC)   . Dementia   . Depression   . Hypertension   . Osteoporosis     Past Surgical History:  Procedure Laterality Date  . APPENDECTOMY    . CATARACT EXTRACTION    . CHOLECYSTECTOMY    . TOTAL HIP ARTHROPLASTY      Family History  Problem Relation Age of Onset  . Family history unknown: Yes    Social History Social History  Substance Use Topics  . Smoking status: Never Smoker  . Smokeless tobacco: Never Used  . Alcohol use No    No Known Allergies  Current Facility-Administered Medications  Medication Dose Route Frequency Provider Last Rate Last Dose  . 0.9 %  sodium chloride infusion   Intravenous Once Arnaldo NatalPaul F Malinda, MD   Stopped at 03/04/17 1615  . piperacillin-tazobactam (ZOSYN) IVPB 3.375 g  3.375 g Intravenous Once Arnaldo NatalPaul F Malinda, MD   Stopped at 03/04/17 1615  . sodium chloride 0.9 % bolus  1,000 mL  1,000 mL Intravenous Once Leafy Roiego F Rafel Garde, MD   Stopped at 03/04/17 1616   Current Outpatient Prescriptions  Medication Sig Dispense Refill  . acetaminophen (TYLENOL) 325 MG tablet Take 650 mg by mouth 3 (three) times daily.    Marland Kitchen. amLODipine (NORVASC) 10 MG tablet Take 10 mg by mouth daily.     . Calcium Citrate-Vitamin D 315-250 MG-UNIT TABS Take 2 tablets by mouth daily.    Marland Kitchen. donepezil (ARICEPT) 10 MG tablet Take 10 mg by mouth at bedtime.    . fluticasone (FLONASE) 50 MCG/ACT nasal spray Place 2 sprays into both nostrils daily.    . furosemide (LASIX) 20 MG tablet Take 20 mg by mouth daily.    Marland Kitchen. glucosamine-chondroitin 500-400 MG tablet Take 1 tablet by mouth daily.    . hydroxypropyl methylcellulose / hypromellose (ISOPTO TEARS / GONIOVISC) 2.5 % ophthalmic solution Place 2 drops into both eyes 4 (four) times daily.    Marland Kitchen. ipratropium-albuterol (DUONEB) 0.5-2.5 (3) MG/3ML SOLN Take 3 mLs by nebulization 3 (three) times daily.    . Melatonin 5 MG TABS Take 5 mg by mouth at bedtime.    . memantine (NAMENDA) 10 MG tablet Take 10 mg by mouth 2 (two) times daily.    . metoprolol (LOPRESSOR) 50 MG tablet Take 50 mg by mouth 2 (two) times daily.     . naproxen (NAPROSYN) 500 MG  tablet Take 500 mg by mouth 2 (two) times daily with a meal.    . nitroGLYCERIN (NITROSTAT) 0.4 MG SL tablet Place 0.4 mg under the tongue every 5 (five) minutes as needed for chest pain.    Marland Kitchen omega-3 acid ethyl esters (LOVAZA) 1 g capsule Take 1 g by mouth daily.    Marland Kitchen omeprazole (PRILOSEC) 40 MG capsule Take 1 capsule (40 mg total) by mouth daily. 30 capsule 1  . potassium chloride SA (KLOR-CON M20) 20 MEQ tablet Take 20 mEq by mouth daily.    . pravastatin (PRAVACHOL) 10 MG tablet Take 10 mg by mouth daily.    . sertraline (ZOLOFT) 50 MG tablet Take 50 mg by mouth daily.    . traMADol (ULTRAM) 50 MG tablet Take 50 mg by mouth 2 (two) times daily.       Review of Systems A 10 point review of systems was asked  and was negative except for the information on the HPI  Physical Exam Blood pressure (!) 139/57, pulse 65, temperature 97.5 F (36.4 C), temperature source Oral, resp. rate 18, height 5' (1.524 m), weight 63.5 kg (140 lb), SpO2 100 %. CONSTITUTIONAL: Elderly  And debilitated female EYES: Pupils are equal, round, and reactive to light, Sclera are non-icteric. EARS, NOSE, MOUTH AND THROAT: The oropharynx is clear. The oral mucosa is pink and moist. Hearing is intact to voice. LYMPH NODES:  Lymph nodes in the neck are normal. RESPIRATORY:  Lungs are clear. There is normal respiratory effort, with equal breath sounds bilaterally, and without pathologic use of accessory muscles. CARDIOVASCULAR: Heart is regular without murmurs, gallops, or rubs. GI: The abdomen is Exquisitely tender to palpation especially in the right lower quadrant. Positive rebound and positive peritoneal signs GU: Rectal deferred.   MUSCULOSKELETAL: Normal muscle strength and tone. No cyanosis or edema.   SKIN: Turgor is good and there are no pathologic skin lesions or ulcers. NEUROLOGIC: Motor and sensation is grossly normal. Cranial nerves are grossly intact. PSYCH:  Oriented to person, place she does forget some things and is hard of hearing.  Data Reviewed  I have personally reviewed the patient's imaging, laboratory findings and medical records.    Assessment/ Plan 83 yo female w multiple co morbidities including CHF, CRF, chronic pain and dementia.  Discussed with the patient and the son about her condition and different options. Most aggressive option would be to proceed to the operating room for emergent exploratory laparotomy and possible bowel resection. Given that she's got significant comorbidities, her age this will carry significant mortality and morbidity risk. All alternatives were also explained in the form of hospice or medical management. At this time with his son and the patient have decided to go to  hospice. He states that she is ready to go and that she is suffering 2 months and would not be able to tolerate a major operation. Since it counseling provided spent a minimal or 70 minutes in this encounter with the majority of time spent in coordination of care and counseling the patient. I will defer to Dr. Juliette Alcide about contacting the hospice team and further disposition of the patient     Sterling Big, MD FACS General Surgeon 03/04/2017, 4:27 PM

## 2017-03-04 NOTE — Progress Notes (Signed)
Spoke to Dr. Letitia LibraJohnston about watery stool. No need to test for CDiff. May use PPE but no need to test; low chance of CDiff with bowel perforation. Crystal Barrera, Crystal Barrera

## 2017-03-04 NOTE — H&P (Addendum)
Crystal Barrera is an 83 y.o. female.   Chief Complaint: Abdominal pain HPI: This is a 83 year old female who is a resident of [facility]. For the past 2 days she's been developing right lower quadrant abdominal pain. She was evaluated in the ER today and was found to have a perforated small bowel the right side. She also has acute renal failure and UTI. Patient has dementia and family members including her son and daughter-in-law who are present have asked for hospice and comfort measures. Her son has her healthcare power of attorney. I discussed hospice home placement and family was agreeable however hospice home did not have any beds available so we will admit her for comfort care in the hospital and get her on the hospice home waiting list.  Past Medical History:  Diagnosis Date  . Arthritis   . CHF (congestive heart failure) ([place])   . Dementia   . Depression   . Hypertension   . Osteoporosis     Past Surgical History:  Procedure Laterality Date  . APPENDECTOMY    . CATARACT EXTRACTION    . CHOLECYSTECTOMY    . TOTAL HIP ARTHROPLASTY      Family History  Problem Relation Age of Onset  . Family history unknown: Yes   Social History:  reports that she has never smoked. She has never used smokeless tobacco. She reports that she does not drink alcohol. Her drug history is not on file.  Allergies: No Known Allergies   (Not in a hospital admission)  Results for orders placed or performed during the hospital encounter of 03/04/17 (from the past 48 hour(s))  CBC     Status: Abnormal   Collection Time: 03/04/17 11:08 AM  Result Value Ref Range   WBC 23.2 (H) 3.6 - 11.0 K/uL   RBC 3.84 3.80 - 5.20 MIL/uL   Hemoglobin 12.3 12.0 - 16.0 g/dL   HCT 37.8 35.0 - 47.0 %   MCV 98.3 80.0 - 100.0 fL   MCH 31.9 26.0 - 34.0 pg   MCHC 32.5 32.0 - 36.0 g/dL   RDW 14.3 11.5 - 14.5 %   Platelets 209 150 - 440 K/uL  Basic metabolic panel     Status: Abnormal   Collection Time: 03/04/17 11:08  AM  Result Value Ref Range   Sodium 136 135 - 145 mmol/L   Potassium 4.4 3.5 - 5.1 mmol/L   Chloride 106 101 - 111 mmol/L   CO2 21 (L) 22 - 32 mmol/L   Glucose, Bld 120 (H) 65 - 99 mg/dL   BUN 59 (H) 6 - 20 mg/dL   Creatinine, Ser 2.53 (H) 0.44 - 1.00 mg/dL   Calcium 10.1 8.9 - 10.3 mg/dL   GFR calc non Af Amer 15 (L) >60 mL/min   GFR calc Af Amer 18 (L) >60 mL/min    Comment: (NOTE) The eGFR has been calculated using the CKD EPI equation. This calculation has not been validated in all clinical situations. eGFR's persistently <60 mL/min signify possible Chronic Kidney Disease.    Anion gap 9 5 - 15  Brain natriuretic peptide     Status: Abnormal   Collection Time: 03/04/17 11:08 AM  Result Value Ref Range   B Natriuretic Peptide 350.0 (H) 0.0 - 100.0 pg/mL  Albumin     Status: Abnormal   Collection Time: 03/04/17 11:08 AM  Result Value Ref Range   Albumin 3.3 (L) 3.5 - 5.0 g/dL  Alkaline phosphatase     Status:  None   Collection Time: 03/04/17 11:08 AM  Result Value Ref Range   Alkaline Phosphatase 92 38 - 126 U/L  ALT     Status: Abnormal   Collection Time: 03/04/17 11:08 AM  Result Value Ref Range   ALT 10 (L) 14 - 54 U/L  AST     Status: None   Collection Time: 03/04/17 11:08 AM  Result Value Ref Range   AST 19 15 - 41 U/L  Differential     Status: Abnormal   Collection Time: 03/04/17 11:08 AM  Result Value Ref Range   Neutrophils Relative % 91 %   Neutro Abs 20.9 (H) 1.4 - 6.5 K/uL   Lymphocytes Relative 4 %   Lymphs Abs 1.0 1.0 - 3.6 K/uL   Monocytes Relative 5 %   Monocytes Absolute 1.2 (H) 0.2 - 0.9 K/uL   Eosinophils Relative 0 %   Eosinophils Absolute 0.0 0 - 0.7 K/uL   Basophils Relative 0 %   Basophils Absolute 0.0 0 - 0.1 K/uL  Bilirubin, total     Status: None   Collection Time: 03/04/17 11:08 AM  Result Value Ref Range   Total Bilirubin 1.0 0.3 - 1.2 mg/dL  Protein, total     Status: None   Collection Time: 03/04/17 11:08 AM  Result Value Ref  Range   Total Protein 6.9 6.5 - 8.1 g/dL  Troponin I     Status: Abnormal   Collection Time: 03/04/17 11:08 AM  Result Value Ref Range   Troponin I 0.04 (HH) <0.03 ng/mL    Comment: CRITICAL RESULT CALLED TO, READ BACK BY AND VERIFIED WITH ANGELA ROBBINS ON 03/04/17 AT 1206 QSD   Lipase, blood     Status: None   Collection Time: 03/04/17 11:08 AM  Result Value Ref Range   Lipase 12 11 - 51 U/L  Lactic acid, plasma     Status: None   Collection Time: 03/04/17 11:50 AM  Result Value Ref Range   Lactic Acid, Venous 1.6 0.5 - 1.9 mmol/L  Influenza panel by PCR (type A & B)     Status: None   Collection Time: 03/04/17 11:50 AM  Result Value Ref Range   Influenza A By PCR NEGATIVE NEGATIVE   Influenza B By PCR NEGATIVE NEGATIVE    Comment: (NOTE) The Xpert Xpress Flu assay is intended as an aid in the diagnosis of  influenza and should not be used as a sole basis for treatment.  This  assay is FDA approved for nasopharyngeal swab specimens only. Nasal  washings and aspirates are unacceptable for Xpert Xpress Flu testing.   Urinalysis, Complete w Microscopic     Status: Abnormal   Collection Time: 03/04/17  1:59 PM  Result Value Ref Range   Color, Urine YELLOW (A) YELLOW   APPearance CLOUDY (A) CLEAR   Specific Gravity, Urine 1.019 1.005 - 1.030   pH 5.0 5.0 - 8.0   Glucose, UA NEGATIVE NEGATIVE mg/dL   Hgb urine dipstick MODERATE (A) NEGATIVE   Bilirubin Urine NEGATIVE NEGATIVE   Ketones, ur NEGATIVE NEGATIVE mg/dL   Protein, ur NEGATIVE NEGATIVE mg/dL   Nitrite POSITIVE (A) NEGATIVE   Leukocytes, UA LARGE (A) NEGATIVE   RBC / HPF 0-5 0 - 5 RBC/hpf   WBC, UA TOO NUMEROUS TO COUNT 0 - 5 WBC/hpf   Bacteria, UA FEW (A) NONE SEEN   Squamous Epithelial / LPF 6-30 (A) NONE SEEN   Mucous PRESENT    Hyaline Casts,  UA PRESENT    Ct Abdomen Pelvis Wo Contrast  Result Date: 03/04/2017 CLINICAL DATA:  83 year old female with a history of possible renal failure and nausea vomiting  EXAM: CT ABDOMEN AND PELVIS WITHOUT CONTRAST TECHNIQUE: Multidetector CT imaging of the abdomen and pelvis was performed following the standard protocol without IV contrast. COMPARISON:  04/05/2016 FINDINGS: Lower chest: Scarring/atelectasis at the lung bases. Large paraesophageal hernia Hepatobiliary: Unremarkable appearance of liver. Surgical changes of cholecystectomy. Expected dilation of the extrahepatic biliary system status post cholecystectomy. Pancreas: Similar appearance of the pancreas with relatively hypodense tissue in the pancreatic head. No inflammatory changes. No calcifications. Spleen: Normal in size without focal abnormality. Adrenals/Urinary Tract: Unremarkable appearance of adrenal glands. Atrophic left kidney, similar to prior. Lesion on the lateral cortex the left kidney with intermediate Hounsfield units, incompletely characterized on the current CT. Size is not significantly changed compared to the prior. Right kidney without hydronephrosis or nephrolithiasis. Unremarkable course of the right ureter. Stomach/Bowel: Hiatal hernia Enteric contrast fills the stomach lumen above and below the diaphragm. Enteric contrast extends through the majority of the small bowel distally. Dilated small bowel loops throughout the abdomen extending into the pelvis. No discrete transition or focal obstruction identified. No focal small bowel wall thickening. There is defect in mesenteric side of small bowel in the right lower quadrant, image 46 of series 2 with associated inflammatory changes. No circumferential bowel wall thickening. Appendix not visualized, and was not visualized on the comparison CT. Focal thickening of the cecal wall adjacent to the mesenteric inflammation favored to be secondary/reactive. Colonic diverticular disease.  Mild dilation of the colon. Vascular/Lymphatic: Atherosclerotic calcifications of the abdominal aorta and iliac system. No aneurysm. Calcifications involve the origins of  the mesenteric vessels in the bilateral renal arteries. Reproductive: Hysterectomy Other: Nonspecific pelvic floor laxity.  No abdominal wall hernia. Musculoskeletal: Surgical changes of left hip arthroplasty. Surgical changes of bilateral pedicle screw and rod fixation of L4-L5. Compression fracture of T12 is unchanged from the comparison CT. No new acute fracture. Multilevel degenerative changes of the thoracolumbar spine. Associated scoliotic curvature. IMPRESSION: Bowel wall defect associated with distal small bowel and associated inflammatory changes of the mesenteric, concerning for focal contained perforation. The diffuse small bowel and colonic distension may represent reactive ileus. These results were called by telephone at the time of interpretation on 03/04/2017 at 2:45 pm to Dr. Conni Slipper , who verbally acknowledged these results. Hiatal hernia again demonstrated. Aortic atherosclerosis. Colonic diverticular disease. Similar appearance of low-density changes involving the pancreatic head, incompletely characterized on noncontrast CT. As was discussed previously, this may represent fatty infiltration or multi cystic changes. If further evaluation is warranted, a follow-up MRI may be considered. Electronically Signed   By: Corrie Mckusick D.O.   On: 03/04/2017 14:48   Dg Chest Portable 1 View  Result Date: 03/04/2017 CLINICAL DATA:  Generalized body aches EXAM: PORTABLE CHEST 1 VIEW COMPARISON:  04/05/2016 chest radiograph. FINDINGS: Stable cardiomediastinal silhouette with mild cardiomegaly and aortic atherosclerosis. No pneumothorax. No pleural effusion. No pulmonary edema. No acute consolidative airspace disease. IMPRESSION: Stable cardiomegaly without pulmonary edema. No active pulmonary disease. Aortic atherosclerosis . Electronically Signed   By: Ilona Sorrel M.D.   On: 03/04/2017 11:37    Review of Systems  Unable to perform ROS: Acuity of condition    Blood pressure (!) 139/57, pulse  65, temperature 97.5 F (36.4 C), temperature source Oral, resp. rate 18, height 5' (1.524 m), weight 63.5 kg (140 lb), SpO2 100 %.  Physical Exam  Constitutional:  Well-nourished female in obvious pain.  HENT:  Head: Normocephalic.  Dry oral mucosa  Eyes: Pupils are equal, round, and reactive to light. No scleral icterus.  Neck: No JVD present. No tracheal deviation present. No thyromegaly present.  Cardiovascular: Normal rate and regular rhythm.   Respiratory: Effort normal and breath sounds normal. No respiratory distress. She has no wheezes.  GI:  Tenderness in right lower quadrant with rebound and guarding  Neurological: She is alert.  Not oriented  Skin: Skin is warm and dry.     Assessment/Plan 1. Perforated small bowel. Patient does have dementia and family does not want surgical intervention. Do not think antibiotics would be effective in this case. Family understands and wishes for comfort care measures. 2. Acute renal failure. Again patient family wants comfort care measures so no IV fluids will be initiated 3. Urinary tract infection. Again comfort care measures  4. CODE STATUS. Patient is now DO NOT RESUSCITATE. We are pursuing comfort care measures only and hospice home placement. Have ordered medications for comfort  Total time spent 40 minutes  Baxter Hire, MD 03/04/2017, 4:34 PM

## 2017-03-04 NOTE — ED Provider Notes (Signed)
[facility] Emergency Department Provider Note   ____________________________________________   First MD Initiated Contact with Patient 03/04/17 1111     (approximate)  I have reviewed the triage vital signs and the nursing notes.   HISTORY  Chief Complaint Generalized Body Aches    HPI Crystal Barrera is a 83 y.o. female who reports she hurts from it bottom of her toes to the top of her head. She reports this is been going on for many months but got much much worse today. Patient reports she's been falling a lot on her front on her back on her right side on her left side she is not sure if this has anything to do with it. The pain today seems to be centered in the right lower quadrant of the abdomen. Worse with pressure on the legs is worse with palpation of the abdomen outpatient left side of the abdomen makes right side abdomen worse to patient reports. There is even a slight amount of pain with light touch. Light touch pain is very mild compared all the other pains.   Past Medical History:  Diagnosis Date  . Arthritis   . CHF (congestive heart failure) (HCC)   . Dementia   . Depression   . Hypertension   . Osteoporosis     There are no active problems to display for this patient.   Past Surgical History:  Procedure Laterality Date  . APPENDECTOMY    . CATARACT EXTRACTION    . CHOLECYSTECTOMY    . TOTAL HIP ARTHROPLASTY      Prior to Admission medications   Medication Sig Start Date End Date Taking? Authorizing Provider  acetaminophen (TYLENOL) 325 MG tablet Take 650 mg by mouth 3 (three) times daily.   Yes Historical Provider, MD  amLODipine (NORVASC) 10 MG tablet Take 10 mg by mouth daily.    Yes Historical Provider, MD  Calcium Citrate-Vitamin D 315-250 MG-UNIT TABS Take 2 tablets by mouth daily.   Yes Historical Provider, MD  donepezil (ARICEPT) 10 MG tablet Take 10 mg by mouth at bedtime.   Yes Historical Provider, MD  fluticasone  (FLONASE) 50 MCG/ACT nasal spray Place 2 sprays into both nostrils daily.   Yes Historical Provider, MD  furosemide (LASIX) 20 MG tablet Take 20 mg by mouth daily.   Yes Historical Provider, MD  glucosamine-chondroitin 500-400 MG tablet Take 1 tablet by mouth daily.   Yes Historical Provider, MD  hydroxypropyl methylcellulose / hypromellose (ISOPTO TEARS / GONIOVISC) 2.5 % ophthalmic solution Place 2 drops into both eyes 4 (four) times daily.   Yes Historical Provider, MD  ipratropium-albuterol (DUONEB) 0.5-2.5 (3) MG/3ML SOLN Take 3 mLs by nebulization 3 (three) times daily.   Yes Historical Provider, MD  Melatonin 5 MG TABS Take 5 mg by mouth at bedtime.   Yes Historical Provider, MD  memantine (NAMENDA) 10 MG tablet Take 10 mg by mouth 2 (two) times daily.   Yes Historical Provider, MD  metoprolol (LOPRESSOR) 50 MG tablet Take 50 mg by mouth 2 (two) times daily.    Yes Historical Provider, MD  naproxen (NAPROSYN) 500 MG tablet Take 500 mg by mouth 2 (two) times daily with a meal. 03/01/17 03/10/17 Yes Historical Provider, MD  nitroGLYCERIN (NITROSTAT) 0.4 MG SL tablet Place 0.4 mg under the tongue every 5 (five) minutes as needed for chest pain.   Yes Historical Provider, MD  omega-3 acid ethyl esters (LOVAZA) 1 g capsule Take 1 g by mouth daily.  Yes Historical Provider, MD  omeprazole (PRILOSEC) 40 MG capsule Take 1 capsule (40 mg total) by mouth daily. 04/05/16 04/05/17 Yes Emily Filbert, MD  potassium chloride SA (KLOR-CON M20) 20 MEQ tablet Take 20 mEq by mouth daily. 02/27/13  Yes Historical Provider, MD  pravastatin (PRAVACHOL) 10 MG tablet Take 10 mg by mouth daily.   Yes Historical Provider, MD  sertraline (ZOLOFT) 50 MG tablet Take 50 mg by mouth daily.   Yes Historical Provider, MD  traMADol (ULTRAM) 50 MG tablet Take 50 mg by mouth 2 (two) times daily.   Yes Historical Provider, MD    Allergies Patient has no known allergies.  Family History  Problem Relation Age of Onset  .  Family history unknown: Yes    Social History Social History  Substance Use Topics  . Smoking status: Never Smoker  . Smokeless tobacco: Never Used  . Alcohol use No    Review of Systems Constitutional: No fever/chills Eyes: No visual changes. ENT: No sore throat. Cardiovascular: Denies chest pain. Respiratory: Denies shortness of breath. Gastrointestinal: See history of present illness. There is no nausea or vomiting. Genitourinary: Negative for dysuria. Musculoskeletal: Negative for back pain. Skin: Negative for rash. Neurological: Negative for headaches, focal weakness or numbness.  10-point ROS otherwise negative.  ____________________________________________   PHYSICAL EXAM:  VITAL SIGNS: ED Triage Vitals  Enc Vitals Group     BP 03/04/17 1054 129/74     Pulse Rate 03/04/17 1054 66     Resp 03/04/17 1054 16     Temp 03/04/17 1054 97.5 F (36.4 C)     Temp Source 03/04/17 1054 Oral     SpO2 03/04/17 1054 99 %     Weight 03/04/17 1055 140 lb (63.5 kg)     Height 03/04/17 1055 5' (1.524 m)     Head Circumference --      Peak Flow --      Pain Score 03/04/17 1056 10     Pain Loc --      Pain Edu? --      Excl. in GC? --     Constitutional: Alert and oriented. Well appearing and in no acute distress. Eyes: Conjunctivae are normal. PERRL. EOMI. Head: Atraumatic. Nose: No congestion/rhinnorhea. Mouth/Throat: Mucous membranes are moist.  Oropharynx non-erythematous. Neck: No stridor. Cardiovascular: Normal rate, regular rhythm. Grossly normal heart sounds.  Good peripheral circulation. Respiratory: Normal respiratory effort.  No retractions. Lungs CTAB. Gastrointestinal: SoftNormal bowel sounds. There is marked tenderness in the right lower quadrant. There is no bruising. Rest of exam is as described in history of present illness No distention. No abdominal bruits. No CVA tenderness. Musculoskeletal: There is diffuse tenderness throughout the body but again it  is worse in the right lower quadrant of the abdomen Neurologic:  Normal speech and language. No gross focal neurologic deficits are appreciated.  Skin:  Skin is warm, dry and intact. No rash noted.   ____________________________________________   LABS (all labs ordered are listed, but only abnormal results are displayed)  Labs Reviewed  CBC - Abnormal; Notable for the following:       Result Value   WBC 23.2 (*)    All other components within normal limits  BASIC METABOLIC PANEL - Abnormal; Notable for the following:    CO2 21 (*)    Glucose, Bld 120 (*)    BUN 59 (*)    Creatinine, Ser 2.53 (*)    GFR calc non Af Amer 15 (*)  GFR calc Af Amer 18 (*)    All other components within normal limits  BRAIN NATRIURETIC PEPTIDE - Abnormal; Notable for the following:    B Natriuretic Peptide 350.0 (*)    All other components within normal limits  URINALYSIS, COMPLETE (UACMP) WITH MICROSCOPIC - Abnormal; Notable for the following:    Color, Urine YELLOW (*)    APPearance CLOUDY (*)    Hgb urine dipstick MODERATE (*)    Nitrite POSITIVE (*)    Leukocytes, UA LARGE (*)    Bacteria, UA FEW (*)    Squamous Epithelial / LPF 6-30 (*)    All other components within normal limits  ALBUMIN - Abnormal; Notable for the following:    Albumin 3.3 (*)    All other components within normal limits  ALT - Abnormal; Notable for the following:    ALT 10 (*)    All other components within normal limits  DIFFERENTIAL - Abnormal; Notable for the following:    Neutro Abs 20.9 (*)    Monocytes Absolute 1.2 (*)    All other components within normal limits  TROPONIN I - Abnormal; Notable for the following:    Troponin I 0.04 (*)    All other components within normal limits  LACTIC ACID, PLASMA  INFLUENZA PANEL BY PCR (TYPE A & B)  ALKALINE PHOSPHATASE  AST  BILIRUBIN, TOTAL  PROTEIN, TOTAL  LIPASE, BLOOD  LACTIC ACID, PLASMA  CBC WITH DIFFERENTIAL/PLATELET    ____________________________________________  EKG  KG read and interpreted by me shows normal sinus rhythm rate of 61 normal axis no acute ST-T wave changes EKG is similar to one done on 04/05/2016 ____________________________________________  RADIOLOGY  y Result   CLINICAL DATA:  Generalized body aches  EXAM: PORTABLE CHEST 1 VIEW  COMPARISON:  04/05/2016 chest radiograph.  FINDINGS: Stable cardiomediastinal silhouette with mild cardiomegaly and aortic atherosclerosis. No pneumothorax. No pleural effusion. No pulmonary edema. No acute consolidative airspace disease.  IMPRESSION: Stable cardiomegaly without pulmonary edema. No active pulmonary disease. Aortic atherosclerosis .   Electronically Signed   By: Delbert PhenixJason A Poff M.D.   On: 03/04/2017 11:37    Radiology calls and says it looks like a small bowel perforation right lower quadrant. ____________________________________________   PROCEDURES  Procedure(s) performed:   Procedures  Critical Care performed:   ____________________________________________   INITIAL IMPRESSION / ASSESSMENT AND PLAN / ED COURSE  Pertinent labs & imaging results that were available during my care of the patient were reviewed by me and considered in my medical decision making (see chart for details).        ____________________________________________   FINAL CLINICAL IMPRESSION(S) / ED DIAGNOSES  Final diagnoses:  Bowel perforation (HCC)      NEW MEDICATIONS STARTED DURING THIS VISIT:  New Prescriptions   No medications on file     Note:  This document was prepared using Dragon voice recognition software and may include unintentional dictation errors.    Arnaldo NatalPaul F Jaedyn Marrufo, MD 03/04/17 [phone]

## 2017-03-04 NOTE — ED Triage Notes (Signed)
[facility]. EMS reports possible renal failure. Pt c/o generalized body aches, worse in RLQ and N/V. No emesis today.

## 2017-03-04 NOTE — Care Management Note (Signed)
Case Management Note  Patient Details  Name: Cheree DittoShirley Moede MRN: [id number] Date of Birth: 1922-03-06  Subjective/Objective:   Hospice Home bed request                 Action/Plan:[facility], [phone] no available beds at this time. Dr Letitia LibraJohnston notified.   Expected Discharge Date:                  Expected Discharge Plan:     In-House Referral:     Discharge planning Services     Post Acute Care Choice:    Choice offered to:     DME Arranged:    DME Agency:     HH Arranged:    HH Agency:     Status of Service:     If discussed at [employer] Meetings, dates discussed:    Additional Comments:  Caren MacadamMichelle Finbar Nippert, RN 03/04/2017, 4:59 PM

## 2017-03-04 NOTE — Progress Notes (Signed)
LCSW called and spoke to Debbie from [facility] and printed off and faxed over patient information [phone], labs, H & P as requested by Hospice coordinator  There are no beds and Clydie BraunKaren may only be able to follow up on Monday with patient.   Delta Air LinesClaudine Lamya Lausch LCSW  [phone]

## 2017-03-04 NOTE — ED Notes (Signed)
Helped pt. Ambulate to toilet.

## 2017-03-04 NOTE — ED Notes (Signed)
Attending MD at bedside talking to family about care options. Per family desires, pt is likely going to be made comfort care/hospice consult. Attending MD Letitia Libra(Johnston) directed this nurse not to run pt's IVF NS bolus or STAT dose of Zosyn. Meds held at this time.

## 2017-03-05 MED ORDER — LOPERAMIDE HCL 2 MG PO CAPS
2.0000 mg | ORAL_CAPSULE | Freq: Four times a day (QID) | ORAL | Status: DC | PRN
Start: 2017-03-05 — End: 2017-03-06

## 2017-03-05 MED ORDER — MORPHINE SULFATE (PF) 2 MG/ML IV SOLN
2.0000 mg | INTRAVENOUS | Status: DC | PRN
  Administered 2017-03-05 – 2017-03-06 (×2): 2 mg via INTRAVENOUS
  Filled 2017-03-05 (×2): qty 1

## 2017-03-05 NOTE — Progress Notes (Signed)
[facility] [place] [facility]   PATIENT NAME: Crystal DittoShirley Barrera    MR#:  [id number]  DATE OF BIRTH:  08-May-1922  SUBJECTIVE:  CHIEF COMPLAINT:  Patient is resting comfortably. Son at bedside   REVIEW OF SYSTEMS:  Unobtainable as the patient is lethargic  DRUG ALLERGIES:  No Known Allergies  VITALS:  Blood pressure 140/64, pulse 69, temperature 97.7 F (36.5 C), temperature source Oral, resp. rate 19, height 5' (1.524 m), weight 55.2 kg (121 lb 9.6 oz), SpO2 98 %.  PHYSICAL EXAMINATION:  GENERAL:  83 y.o.-year-old patient lying in the bed with no acute distress.  t.  HEENT: Head atraumatic, normocephalic.  NECK:  Supple, no jugular venous distention. No thyroid enlargement, no tenderness.  LUNGS:Moderate breath sounds bilaterally, no wheezing, rales,rhonchi or crepitation. No use of accessory muscles of respiration.  CARDIOVASCULAR: S1, S2 normal. No murmurs, rubs, or gallops.  ABDOMEN: No bowel sounds  LABORATORY PANEL:   CBC  Recent Labs Lab 03/04/17 1108  WBC 23.2*  HGB 12.3  HCT 37.8  PLT 209   ------------------------------------------------------------------------------------------------------------------  Chemistries   Recent Labs Lab 03/04/17 1108  NA 136  K 4.4  CL 106  CO2 21*  GLUCOSE 120*  BUN 59*  CREATININE 2.53*  CALCIUM 10.1  AST 19  ALT 10*  ALKPHOS 92  BILITOT 1.0   ------------------------------------------------------------------------------------------------------------------  Cardiac Enzymes  Recent Labs Lab 03/04/17 1108  TROPONINI 0.04*   ------------------------------------------------------------------------------------------------------------------  RADIOLOGY:  Ct Abdomen Pelvis Wo Contrast  Result Date: 03/04/2017 CLINICAL DATA:  83 year old female with a history of possible renal failure and nausea vomiting EXAM: CT ABDOMEN AND PELVIS WITHOUT CONTRAST TECHNIQUE: Multidetector CT imaging  of the abdomen and pelvis was performed following the standard protocol without IV contrast. COMPARISON:  04/05/2016 FINDINGS: Lower chest: Scarring/atelectasis at the lung bases. Large paraesophageal hernia Hepatobiliary: Unremarkable appearance of liver. Surgical changes of cholecystectomy. Expected dilation of the extrahepatic biliary system status post cholecystectomy. Pancreas: Similar appearance of the pancreas with relatively hypodense tissue in the pancreatic head. No inflammatory changes. No calcifications. Spleen: Normal in size without focal abnormality. Adrenals/Urinary Tract: Unremarkable appearance of adrenal glands. Atrophic left kidney, similar to prior. Lesion on the lateral cortex the left kidney with intermediate Hounsfield units, incompletely characterized on the current CT. Size is not significantly changed compared to the prior. Right kidney without hydronephrosis or nephrolithiasis. Unremarkable course of the right ureter. Stomach/Bowel: Hiatal hernia Enteric contrast fills the stomach lumen above and below the diaphragm. Enteric contrast extends through the majority of the small bowel distally. Dilated small bowel loops throughout the abdomen extending into the pelvis. No discrete transition or focal obstruction identified. No focal small bowel wall thickening. There is defect in mesenteric side of small bowel in the right lower quadrant, image 46 of series 2 with associated inflammatory changes. No circumferential bowel wall thickening. Appendix not visualized, and was not visualized on the comparison CT. Focal thickening of the cecal wall adjacent to the mesenteric inflammation favored to be secondary/reactive. Colonic diverticular disease.  Mild dilation of the colon. Vascular/Lymphatic: Atherosclerotic calcifications of the abdominal aorta and iliac system. No aneurysm. Calcifications involve the origins of the mesenteric vessels in the bilateral renal arteries. Reproductive: Hysterectomy  Other: Nonspecific pelvic floor laxity.  No abdominal wall hernia. Musculoskeletal: Surgical changes of left hip arthroplasty. Surgical changes of bilateral pedicle screw and rod fixation of L4-L5. Compression fracture of T12 is unchanged from the comparison CT. No new acute fracture. Multilevel degenerative changes  of the thoracolumbar spine. Associated scoliotic curvature. IMPRESSION: Bowel wall defect associated with distal small bowel and associated inflammatory changes of the mesenteric, concerning for focal contained perforation. The diffuse small bowel and colonic distension may represent reactive ileus. These results were called by telephone at the time of interpretation on 03/04/2017 at 2:45 pm to Dr. Dorothea Glassman , who verbally acknowledged these results. Hiatal hernia again demonstrated. Aortic atherosclerosis. Colonic diverticular disease. Similar appearance of low-density changes involving the pancreatic head, incompletely characterized on noncontrast CT. As was discussed previously, this may represent fatty infiltration or multi cystic changes. If further evaluation is warranted, a follow-up MRI may be considered. Electronically Signed   By: Gilmer Mor D.O.   On: 03/04/2017 14:48   Dg Chest Portable 1 View  Result Date: 03/04/2017 CLINICAL DATA:  Generalized body aches EXAM: PORTABLE CHEST 1 VIEW COMPARISON:  04/05/2016 chest radiograph. FINDINGS: Stable cardiomediastinal silhouette with mild cardiomegaly and aortic atherosclerosis. No pneumothorax. No pleural effusion. No pulmonary edema. No acute consolidative airspace disease. IMPRESSION: Stable cardiomegaly without pulmonary edema. No active pulmonary disease. Aortic atherosclerosis . Electronically Signed   By: Delbert Phenix M.D.   On: 03/04/2017 11:37    EKG:   Orders placed or performed during the hospital encounter of 03/04/17  . EKG 12-Lead  . EKG 12-Lead  . ED EKG  . ED EKG    ASSESSMENT AND PLAN:   Patient came in with  abdominal pain diagnosed with perforated small bowel, not a surgical candidate for surgery Some healthcare power of attorney has chosen it measures  1. Perforated small bowel. Patient does have dementia and family does not want surgical intervention. Son is agreeable and wishes for comfort care measures. Morphine IV for comfort care Palliative care consult  2. Acute renal failure.  patient family wants comfort care measures so no IV fluids   3. Urinary tract infection. comfort care measures  4. CODE STATUS. Patient is now DO NOT RESUSCITATE. We are pursuing comfort care measures only and hospice home placement. Have ordered medications for comfort   All the records are reviewed and case discussed with Care Management/Social Workerr. Management plans discussed with the patient, family and they are in agreement.  CODE STATUS: DO NOT RESUSCITATE with comfort care measures  TOTAL TIME TAKING CARE OF THIS PATIENT: 25 minutes.   POSSIBLE D/C IN 1-2 DAYS, DEPENDING ON CLINICAL CONDITION.  Note: This dictation was prepared with Dragon dictation along with smaller phrase technology. Any transcriptional errors that result from this process are unintentional.   Ramonita Lab M.D on 03/05/2017 at 1:59 PM  Between 7am to 6pm - Pager - [phone] After 6pm go to [email or web address] - password [email or web address] [facility]  Office  [phone]  CC: Primary care physician; Angela Cox, MD

## 2017-03-05 NOTE — Clinical Social Work Note (Signed)
Clinical Social Work Assessment  Patient Details  Name: Crystal DittoShirley Goeller MRN: [id number] Date of Birth: 1922-02-20  Date of referral:  03/05/17               Reason for consult:  End of Life/Hospice                Permission sought to share information with:    Permission granted to share information::     Name::        Agency::     Relationship::     Contact Information:     Housing/Transportation Living arrangements for the past 2 months:  Assisted Living Facility Source of Information:  Medical Team Patient Interpreter Needed:  None Criminal Activity/Legal Involvement Pertinent to Current Situation/Hospitalization:  No - Comment as needed Significant Relationships:  Adult Children Lives with:  Facility Resident Do you feel safe going back to the place where you live?  Yes Need for family participation in patient care:  Yes (Comment)  Care giving concerns:  Patient referred for hospice home placement.   Social Worker assessment / plan:  CSW attempted to meet with family to confirm; however, family not available and patient was resting. CSW culled information from medical team.  [facility] and is currently on comfort care measures. Currently, no beds are available, and at least 5 other patients are waiting for a bed. The liaison for Hospice Home plans to follow up with the family on Monday.   Employment status:  Retired Database administratornsurance information:  Managed Medicare PT Recommendations:    Information / Referral to community resources:  Other (Comment Required) ([facility])  Patient/Family's Response to care:  Unable to assess  Patient/Family's Understanding of and Emotional Response to Diagnosis, Current Treatment, and Prognosis:  According the the medical chart, the patient's family is in agreement with comfort care and hospice referral.   Emotional Assessment Appearance:  Appears stated age Attitude/Demeanor/Rapport:    (Patient was resting.) Affect (typically observed):   (Patient was resting.) Orientation:  Oriented to Self Alcohol / Substance use:  Never Used Psych involvement (Current and /or in the community):  No (Comment)  Discharge Needs  Concerns to be addressed:  Care Coordination, Grief and Loss Concerns Readmission within the last 30 days:  No Current discharge risk:  Chronically ill, Terminally ill Barriers to Discharge:  Hospice Bed not available   Judi CongKaren M Gates Jividen, LCSW 03/05/2017, 1:35 PM

## 2017-03-05 NOTE — Progress Notes (Signed)
Spoke to Dr. Sheryle Hailiamond; family had asked for PRN for loose stools if needed. MD to place order. No more loose stools since shift started. Louis MeckelMcRae, Rylen Swindler H

## 2017-03-06 DIAGNOSIS — Z515 Encounter for palliative care: Secondary | ICD-10-CM

## 2017-03-06 DIAGNOSIS — Z7189 Other specified counseling: Secondary | ICD-10-CM

## 2017-03-06 MED ORDER — GLYCOPYRROLATE 1 MG PO TABS: 1.0000 mg | ORAL_TABLET | ORAL | Status: AC | PRN

## 2017-03-06 MED ORDER — LOPERAMIDE HCL 2 MG PO CAPS: 2.0000 mg | ORAL_CAPSULE | Freq: Four times a day (QID) | ORAL | 0 refills | Status: AC | PRN

## 2017-03-06 MED ORDER — HALOPERIDOL LACTATE 2 MG/ML PO CONC: 0.5000 mg | ORAL | 0 refills | Status: AC | PRN

## 2017-03-06 MED ORDER — SODIUM CHLORIDE 0.9 % IV SOLN: 1.0000 mg/h | INTRAVENOUS | 0 refills | Status: AC

## 2017-03-06 MED ORDER — ONDANSETRON 4 MG PO TBDP: 4.0000 mg | ORAL_TABLET | Freq: Four times a day (QID) | ORAL | 0 refills | Status: AC | PRN

## 2017-03-06 MED ORDER — RESOURCE THICKENUP CLEAR PO POWD
ORAL | Status: DC | PRN
  Filled 2017-03-06: qty 125

## 2017-03-06 MED ORDER — ACETAMINOPHEN 325 MG PO TABS: 650.0000 mg | ORAL_TABLET | Freq: Four times a day (QID) | ORAL | Status: AC | PRN

## 2017-03-06 MED ORDER — MORPHINE BOLUS VIA INFUSION
2.0000 mg | INTRAVENOUS | Status: DC | PRN
Start: 2017-03-06 — End: 2017-03-06
  Filled 2017-03-06: qty 2

## 2017-03-06 MED ORDER — LORAZEPAM 2 MG/ML PO CONC: 1.0000 mg | ORAL | 0 refills | Status: AC | PRN

## 2017-03-06 MED ORDER — POLYVINYL ALCOHOL 1.4 % OP SOLN: 1.0000 [drp] | Freq: Four times a day (QID) | OPHTHALMIC | 0 refills | Status: AC | PRN

## 2017-03-06 MED ORDER — SODIUM CHLORIDE 0.9 % IV SOLN
0.5000 mg/h | INTRAVENOUS | Status: DC
  Administered 2017-03-06: 12:00:00 0.5 mg/h via INTRAVENOUS
  Administered 2017-03-06: 1 mg/h via INTRAVENOUS
  Filled 2017-03-06: qty 10

## 2017-03-06 NOTE — Clinical Social Work Note (Signed)
Pt is ready for discharge today and will go to [facility]. CSW met with pt's family, who is in agreement. Hospice Liaison made arrangements for transfer. CSW prepared discharge packet. [facility] will provide transportation. CSW is signing off as no further needs identified.   Darden Dates, MSW, LCSW  Clinical Social Worker  [phone]

## 2017-03-06 NOTE — Discharge Summary (Signed)
[facility] -  at [facility]   PATIENT NAME: Crystal DittoShirley Barrera    MR#:  [id number]  DATE OF BIRTH:  Mar 01, 1922  DATE OF ADMISSION:  03/04/2017 ADMITTING PHYSICIAN: Gracelyn NurseJohn D Johnston, MD  DATE OF DISCHARGE: No discharge date for patient encounter.  PRIMARY CARE PHYSICIAN: Angela Coxasanayaka, Gayani Y, MD    ADMISSION DIAGNOSIS:  Bowel perforation (HCC) [K63.1] Small bowel perforation (HCC) [K63.1]  DISCHARGE DIAGNOSIS:  Active Problems:   Small bowel perforation [facility](HCC)   Palliative care encounter   Terminal care   Encounter for hospice care discussion   SECONDARY DIAGNOSIS:   Past Medical History:  Diagnosis Date  . Arthritis   . CHF (congestive heart failure) (HCC)   . Dementia   . Depression   . Hypertension   . Osteoporosis     HOSPITAL COURSE:  HPI: This is a 83 year old female who is a resident of [facility]. For the past 2 days she's been developing right lower quadrant abdominal pain. She was evaluated in the ER today and was found to have a perforated small bowel the right side. She also has acute renal failure and UTI. Patient has dementia and family members including her son and daughter-in-law who are present have asked for hospice and comfort measures. Her son has her healthcare power of attorney. I discussed hospice home placement and family was agreeable however hospice home did not have any beds available so we will admit her for comfort care in the hospital and get her on the hospice home waiting list.  Hospital course  1. Perforated small bowel. Patient does have dementia and family does not want surgical intervention.  Son(hcpoa ) is agreeable  for comfort care measures. Morphine IV gtt for comfort care, currently at 1 mg per hour Palliative care consulted, patient will be transferred to hospice home today it is available  2. Acute renal failure.   comfort care measures so no IV fluids   3. Urinary tract infection. comfort care  measures  4. CODE STATUS. Patient is  DO NOT RESUSCITATE. We are pursuing comfort care measures only    hospice home placement   DISCHARGE CONDITIONS:   Guarded but hemodynamically stable  CONSULTS OBTAINED:     PROCEDURES None  DRUG ALLERGIES:  No Known Allergies  DISCHARGE MEDICATIONS:   Current Discharge Medication List    START taking these medications   Details  glycopyrrolate (ROBINUL) 1 MG tablet Take 1 tablet (1 mg total) by mouth every 4 (four) hours as needed (excessive secretions).    haloperidol (HALDOL) 2 MG/ML solution Place 0.3 mLs (0.6 mg total) under the tongue every 4 (four) hours as needed for agitation (or delirium). Qty: 30 mL, Refills: 0    loperamide (IMODIUM) 2 MG capsule Take 1 capsule (2 mg total) by mouth every 6 (six) hours as needed for diarrhea or loose stools. Qty: 30 capsule, Refills: 0    LORazepam (ATIVAN) 2 MG/ML concentrated solution Place 0.5 mLs (1 mg total) under the tongue every 4 (four) hours as needed for anxiety. Qty: 30 mL, Refills: 0    morphine 250 mg in sodium chloride 0.9 % 240 mL Inject 1 mg/hr into the vein continuous. Qty: 100 mL, Refills: 0    ondansetron (ZOFRAN-ODT) 4 MG disintegrating tablet Take 1 tablet (4 mg total) by mouth every 6 (six) hours as needed for nausea. Qty: 20 tablet, Refills: 0    polyvinyl alcohol (LIQUIFILM TEARS) 1.4 % ophthalmic solution Place 1 drop into  both eyes 4 (four) times daily as needed for dry eyes. Qty: 15 mL, Refills: 0      CONTINUE these medications which have CHANGED   Details  acetaminophen (TYLENOL) 325 MG tablet Take 2 tablets (650 mg total) by mouth every 6 (six) hours as needed for mild pain (or Fever >/= 101).      CONTINUE these medications which have NOT CHANGED   Details  hydroxypropyl methylcellulose / hypromellose (ISOPTO TEARS / GONIOVISC) 2.5 % ophthalmic solution Place 2 drops into both eyes 4 (four) times daily.    ipratropium-albuterol (DUONEB) 0.5-2.5  (3) MG/3ML SOLN Take 3 mLs by nebulization 3 (three) times daily.      STOP taking these medications     amLODipine (NORVASC) 10 MG tablet      Calcium Citrate-Vitamin D 315-250 MG-UNIT TABS      donepezil (ARICEPT) 10 MG tablet      fluticasone (FLONASE) 50 MCG/ACT nasal spray      furosemide (LASIX) 20 MG tablet      glucosamine-chondroitin 500-400 MG tablet      Melatonin 5 MG TABS      memantine (NAMENDA) 10 MG tablet      metoprolol (LOPRESSOR) 50 MG tablet      naproxen (NAPROSYN) 500 MG tablet      nitroGLYCERIN (NITROSTAT) 0.4 MG SL tablet      omega-3 acid ethyl esters (LOVAZA) 1 g capsule      omeprazole (PRILOSEC) 40 MG capsule      potassium chloride SA (KLOR-CON M20) 20 MEQ tablet      pravastatin (PRAVACHOL) 10 MG tablet      sertraline (ZOLOFT) 50 MG tablet      traMADol (ULTRAM) 50 MG tablet          DISCHARGE INSTRUCTIONS:   Transfer patient Hospice home  DIET:   npo  DISCHARGE CONDITION:  Fair  ACTIVITY:  Bedrest  OXYGEN:  Home Oxygen: No.   Oxygen Delivery: room air  DISCHARGE LOCATION:  Hospice home   If you experience worsening of your admission symptoms, develop shortness of breath, life threatening emergency, suicidal or homicidal thoughts you must seek medical attention immediately by calling 911 or calling your MD immediately  if symptoms less severe.  You Must read complete instructions/literature along with all the possible adverse reactions/side effects for all the Medicines you take and that have been prescribed to you. Take any new Medicines after you have completely understood and accpet all the possible adverse reactions/side effects.   Please note  You were cared for by a hospitalist during your hospital stay. If you have any questions about your discharge medications or the care you received while you were in the hospital after you are discharged, you can call the unit and asked to speak with the hospitalist on  call if the hospitalist that took care of you is not available. Once you are discharged, your primary care physician will handle any further medical issues. Please note that NO REFILLS for any discharge medications will be authorized once you are discharged, as it is imperative that you return to your primary care physician (or establish a relationship with a primary care physician if you do not have one) for your aftercare needs so that they can reassess your need for medications and monitor your lab values.     Today  Chief Complaint  Patient presents with  . Generalized Body Aches   Today patient is f little uncomfortable, for pain  morphine dose increased to 1 mg/h  ROS: Unobtainable as the patient is lethargic  VITAL SIGNS:  Blood pressure 128/61, pulse (!) 101, temperature 97.7 F (36.5 C), temperature source Axillary, resp. rate 20, height 5' (1.524 m), weight 55.2 kg (121 lb 9.6 oz), SpO2 93 %.  I/O:    Intake/Output Summary (Last 24 hours) at 03/06/17 1406 Last data filed at 03/06/17 0800  Gross per 24 hour  Intake                0 ml  Output                0 ml  Net                0 ml    PHYSICAL EXAMINATION:  GENERAL:  83 y.o.-year-old patient lying in the bed with no acute distress.  HEENT: Head atraumatic, normocephalic. Oropharynx and nasopharynx clear.  NECK:  Supple, no jugular venous distention. No thyroid enlargement, no tenderness.  LUNGS: Normal breath sounds bilaterally, no wheezing, rales,rhonchi or crepitation. No use of accessory muscles of respiration.  CARDIOVASCULAR: S1, S2 normal. No murmurs, rubs, or gallops.  ABDOMEN:  nondistended.   DATA REVIEW:   CBC  Recent Labs Lab 03/04/17 1108  WBC 23.2*  HGB 12.3  HCT 37.8  PLT 209    Chemistries   Recent Labs Lab 03/04/17 1108  NA 136  K 4.4  CL 106  CO2 21*  GLUCOSE 120*  BUN 59*  CREATININE 2.53*  CALCIUM 10.1  AST 19  ALT 10*  ALKPHOS 92  BILITOT 1.0    Cardiac  Enzymes  Recent Labs Lab 03/04/17 1108  TROPONINI 0.04*    Microbiology Results  No results found for this or any previous visit.  RADIOLOGY:  Ct Abdomen Pelvis Wo Contrast  Result Date: 03/04/2017 CLINICAL DATA:  83 year old female with a history of possible renal failure and nausea vomiting EXAM: CT ABDOMEN AND PELVIS WITHOUT CONTRAST TECHNIQUE: Multidetector CT imaging of the abdomen and pelvis was performed following the standard protocol without IV contrast. COMPARISON:  04/05/2016 FINDINGS: Lower chest: Scarring/atelectasis at the lung bases. Large paraesophageal hernia Hepatobiliary: Unremarkable appearance of liver. Surgical changes of cholecystectomy. Expected dilation of the extrahepatic biliary system status post cholecystectomy. Pancreas: Similar appearance of the pancreas with relatively hypodense tissue in the pancreatic head. No inflammatory changes. No calcifications. Spleen: Normal in size without focal abnormality. Adrenals/Urinary Tract: Unremarkable appearance of adrenal glands. Atrophic left kidney, similar to prior. Lesion on the lateral cortex the left kidney with intermediate Hounsfield units, incompletely characterized on the current CT. Size is not significantly changed compared to the prior. Right kidney without hydronephrosis or nephrolithiasis. Unremarkable course of the right ureter. Stomach/Bowel: Hiatal hernia Enteric contrast fills the stomach lumen above and below the diaphragm. Enteric contrast extends through the majority of the small bowel distally. Dilated small bowel loops throughout the abdomen extending into the pelvis. No discrete transition or focal obstruction identified. No focal small bowel wall thickening. There is defect in mesenteric side of small bowel in the right lower quadrant, image 46 of series 2 with associated inflammatory changes. No circumferential bowel wall thickening. Appendix not visualized, and was not visualized on the comparison CT.  Focal thickening of the cecal wall adjacent to the mesenteric inflammation favored to be secondary/reactive. Colonic diverticular disease.  Mild dilation of the colon. Vascular/Lymphatic: Atherosclerotic calcifications of the abdominal aorta and iliac system. No aneurysm. Calcifications involve the origins of the mesenteric  vessels in the bilateral renal arteries. Reproductive: Hysterectomy Other: Nonspecific pelvic floor laxity.  No abdominal wall hernia. Musculoskeletal: Surgical changes of left hip arthroplasty. Surgical changes of bilateral pedicle screw and rod fixation of L4-L5. Compression fracture of T12 is unchanged from the comparison CT. No new acute fracture. Multilevel degenerative changes of the thoracolumbar spine. Associated scoliotic curvature. IMPRESSION: Bowel wall defect associated with distal small bowel and associated inflammatory changes of the mesenteric, concerning for focal contained perforation. The diffuse small bowel and colonic distension may represent reactive ileus. These results were called by telephone at the time of interpretation on 03/04/2017 at 2:45 pm to Dr. Dorothea Glassman , who verbally acknowledged these results. Hiatal hernia again demonstrated. Aortic atherosclerosis. Colonic diverticular disease. Similar appearance of low-density changes involving the pancreatic head, incompletely characterized on noncontrast CT. As was discussed previously, this may represent fatty infiltration or multi cystic changes. If further evaluation is warranted, a follow-up MRI may be considered. Electronically Signed   By: Gilmer Mor D.O.   On: 03/04/2017 14:48   Dg Chest Portable 1 View  Result Date: 03/04/2017 CLINICAL DATA:  Generalized body aches EXAM: PORTABLE CHEST 1 VIEW COMPARISON:  04/05/2016 chest radiograph. FINDINGS: Stable cardiomediastinal silhouette with mild cardiomegaly and aortic atherosclerosis. No pneumothorax. No pleural effusion. No pulmonary edema. No acute  consolidative airspace disease. IMPRESSION: Stable cardiomegaly without pulmonary edema. No active pulmonary disease. Aortic atherosclerosis . Electronically Signed   By: Delbert Phenix M.D.   On: 03/04/2017 11:37    EKG:   Orders placed or performed during the hospital encounter of 03/04/17  . EKG 12-Lead  . EKG 12-Lead  . ED EKG  . ED EKG      Management plans discussed with the patient's Son and daughter-in-law are in agreement.  CODE STATUS:     Code Status Orders        Start     Ordered   03/04/17 1840  Do not attempt resuscitation (DNR)  Continuous    Question Answer Comment  In the event of cardiac or respiratory ARREST Do not call a "code blue"   In the event of cardiac or respiratory ARREST Do not perform Intubation, CPR, defibrillation or ACLS   In the event of cardiac or respiratory ARREST Use medication by any route, position, wound care, and other measures to relive pain and suffering. May use oxygen, suction and manual treatment of airway obstruction as needed for comfort.      03/04/17 1839    Code Status History    Date Active Date Inactive Code Status Order ID Comments User Context   This patient has a current code status but no historical code status.    Advance Directive Documentation     Most Recent Value  Type of Advance Directive  Healthcare Power of Attorney  Pre-existing out of facility DNR order (yellow form or pink MOST form)  -  "MOST" Form in Place?  -      TOTAL TIME TAKING CARE OF THIS PATIENT: 43  minutes.   Note: This dictation was prepared with Dragon dictation along with smaller phrase technology. Any transcriptional errors that result from this process are unintentional.   @MEC @  on 03/06/2017 at 2:06 PM  Between 7am to 6pm - Pager - [phone]  After 6pm go to [email or web address] - password [email or web address] [facility]  [facility]  Office  [phone]  CC: Primary care physician; Angela Cox, MD

## 2017-03-06 NOTE — Progress Notes (Signed)
Patient discharged to Hospice Home per MD order. EMS called for transportation.

## 2017-03-06 NOTE — Discharge Instructions (Signed)
Follow-up with hospice care provider at the hospice home

## 2017-03-06 NOTE — Consult Note (Signed)
Consultation Note Date: 03/06/2017   Patient Name: Crystal Barrera  DOB: 1922-11-12  MRN: [id number]  Age / Sex: 83 y.o., female  PCP: Merlene Laughter, MD Referring Physician: Nicholes Mango, MD  Reason for Consultation: Hospice Evaluation, Non pain symptom management, Pain control, Psychosocial/spiritual support and Terminal Care  HPI/Patient Profile: 83 y.o. female  with past medical history of dementia, arthritis, HLD, HTN who was admitted on 03/04/2017 with small bowel perforation. At the time of admission the family opted for comfort measures.  Clinical Assessment and Goals of Care: I met at bedside with the patient and her grand son (who used to work in the ICU here as and Therapist, sports).  He described her as being relatively good shape previously.  Living in ALF, walking, eating, talking (very HOH).  He states recently she has begun falling.   Today he states she is reasonably comfortable but appears in pain when she awakens.  The family is trying to make a decision about where to go after the hospital.  They are comfortable with hospice house.  We discussed prognosis of hours to days.  Due to a hard distended abdomen and restlessness when she awakens - we discussed a morphine gtt for comfort.  Grandson agrees.  Primary Decision Maker:  NEXT OF KIN    SUMMARY OF RECOMMENDATIONS    Code Status/Advance Care Planning:  DNR    Symptom Management:   Morphine gtt with prn boluses.  Additional Recommendations (Limitations, Scope, Preferences):  Full Comfort Care and Minimize Medications  Palliative Prophylaxis:   Frequent Pain Assessment and Oral Care  Prognosis:   Hours - Days  Discharge Planning: Hospice facility      Primary Diagnoses: Present on Admission: . Small bowel perforation ([place])   I have reviewed the medical record, interviewed the patient and family, and examined the patient.  The following aspects are pertinent.  Past Medical History:  Diagnosis Date  . Arthritis   . CHF (congestive heart failure) ([place])   . Dementia   . Depression   . Hypertension   . Osteoporosis    Social History   Social History  . Marital status: Single    Spouse name: N/A  . Number of children: N/A  . Years of education: N/A   Social History Main Topics  . Smoking status: Never Smoker  . Smokeless tobacco: Never Used  . Alcohol use No  . Drug use: Unknown  . Sexual activity: Not Asked   Other Topics Concern  . None   Social History Narrative  . None   Family History  Problem Relation Age of Onset  . Family history unknown: Yes   Scheduled Meds: Continuous Infusions: . morphine     PRN Meds:.acetaminophen **OR** acetaminophen, antiseptic oral rinse, glycopyrrolate **OR** glycopyrrolate **OR** glycopyrrolate, haloperidol **OR** haloperidol **OR** haloperidol lactate, loperamide, LORazepam **OR** LORazepam **OR** LORazepam, morphine injection, morphine, morphine CONCENTRATE **OR** morphine CONCENTRATE, ondansetron **OR** ondansetron (ZOFRAN) IV, polyvinyl alcohol, RESOURCE THICKENUP CLEAR No Known Allergies Review of Systems patient asleep  Physical Exam  Thin frail elderly female, sleeping CV rrr, tachy Resp cta Abdomen hard distended   Vital Signs: BP 128/61 (BP Location: Left Arm)   Pulse (!) 101   Temp 97.7 F (36.5 C) (Axillary)   Resp 20   Ht 5' (1.524 m)   Wt 55.2 kg (121 lb 9.6 oz)   SpO2 93%   BMI 23.75 kg/m  Pain Assessment: PAINAD   Pain Score: Asleep   SpO2: SpO2: 93 % O2 Device:SpO2: 93 % O2 Flow Rate: .   IO: Intake/output summary:  Intake/Output Summary (Last 24 hours) at 03/06/17 1007 Last data filed at 03/06/17 0800  Gross per 24 hour  Intake                0 ml  Output                0 ml  Net                0 ml    LBM: Last BM Date: 03/05/17 Baseline Weight: Weight: 63.5 kg (140 lb) Most recent weight: Weight: 55.2 kg  (121 lb 9.6 oz)     Palliative Assessment/Data:   Flowsheet Rows     Most Recent Value  Intake Tab  Referral Department  Hospitalist  Unit at Time of Referral  Med/Surg Unit  Palliative Care Primary Diagnosis  Sepsis/Infectious Disease  Date Notified  03/04/17  Palliative Care Type  New Palliative care  Reason for referral  End of Life Care Assistance, Psychosocial or Spiritual support, Pain  Date of Admission  03/04/17  Date first seen by Palliative Care  03/06/17  # of days Palliative referral response time  2 Day(s)  # of days IP prior to Palliative referral  0  Clinical Assessment  Palliative Performance Scale Score  10%  Pain Max last 24 hours  6  Pain Min Last 24 hours  3  Psychosocial & Spiritual Assessment  Palliative Care Outcomes  Patient/Family meeting held?  Yes  Who was at the meeting?  yes  Palliative Care Outcomes  Improved pain interventions, Improved non-pain symptom therapy, Counseled regarding hospice, Provided psychosocial or spiritual support      Time In: 9:30 Time Out: 10:20 Time Total: 50 min. Greater than 50%  of this time was spent counseling and coordinating care related to the above assessment and plan.  Signed by: Imogene Burn, PA-C Palliative Medicine   Please contact Palliative Medicine Team phone at [phone] for questions and concerns.  For individual provider: See Shea Evans

## 2017-03-06 NOTE — Progress Notes (Signed)
Follow up visit to new Hospice home referral from over the weekend. Patient to the ED with abdominal pain on 3/24, CT in the ED reveal a small bowel perforation and her family chose not to pursue surgical intervention. Palliative Medicine was consulted and met with the family this morning. Comfort care was confirmed. Patient was started on a continuous morphine drip today, current dose is 1 mg/hr. Patient seen lying in bed, does rouse to verbal stimuli. Son Juanda Crumble and daughter in law June at bedside. Writer initiated education regarding hospice services, philosophy and team approach to care with good understanding voiced. Questions answered, consents signed. Updated information and discharge summary faxed to referral. Report called to the Hospice home, EMS notified for transport. Hospital Care team all aware. Family updated. Thank you. Flo Shanks RN, BSN, [place] [place], [facility] [phone]

## 2017-03-12 DEATH — deceased

## 2017-05-01 IMAGING — CT CT ABD-PELV W/ CM
2 of 5 series · 15 of 46 positions shown, 17 images · IV contrast (iopamidol)
Comparison: Chest radiograph earlier this day.

CLINICAL DATA: Diffuse and epigastric pain.  Dyspnea.

EXAM:
CT ABDOMEN AND PELVIS WITH CONTRAST
TECHNIQUE: Multidetector CT imaging of the abdomen and pelvis was performed
using the standard protocol following bolus administration of
intravenous contrast.
CONTRAST:  80mL LSBLMJ-HPP IOPAMIDOL (LSBLMJ-HPP) INJECTION 61%

[Series 2: routine abd pel with · axial · 0.75mm/px · z∈[-393,-18]mm · 12 of 85 slices shown, 14 images]
[im 5/85  soft-tissue]
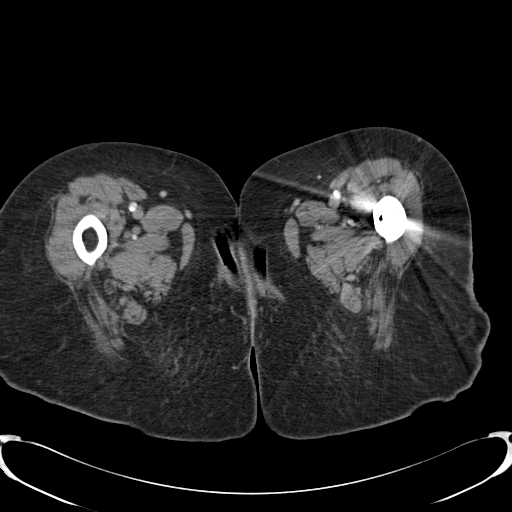
[im 5/85  bone]
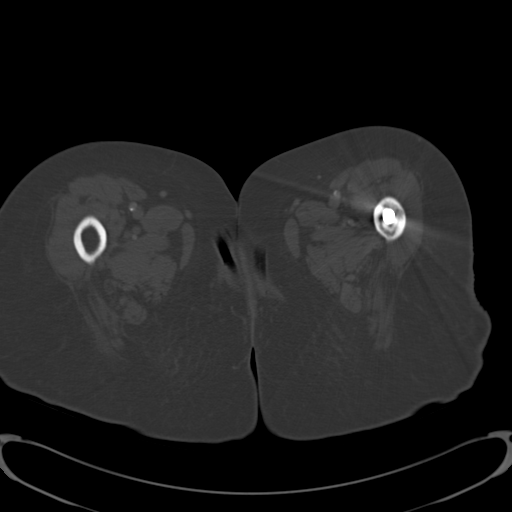
[im 14/85  soft-tissue]
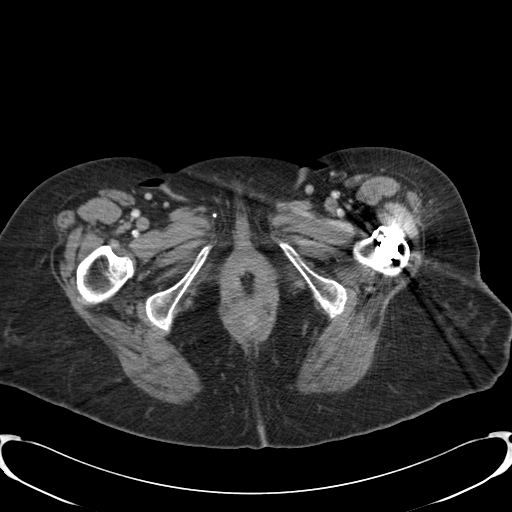
[im 18/85  soft-tissue]
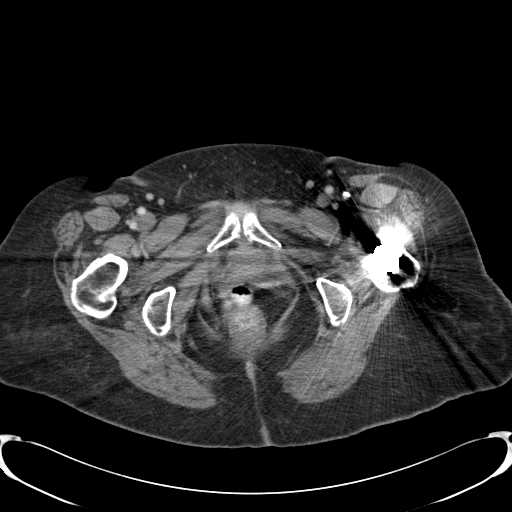
[im 27/85  soft-tissue]
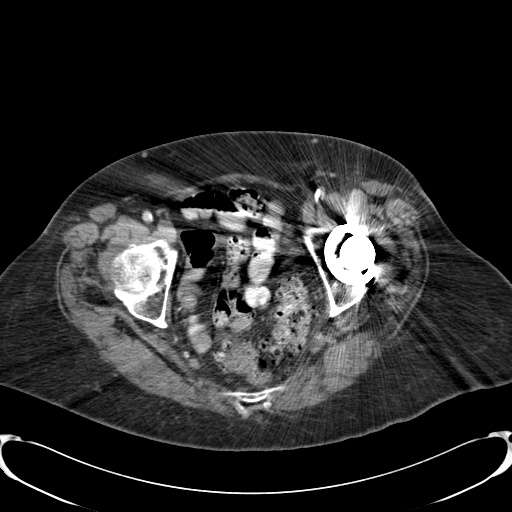
[im 31/85  soft-tissue]
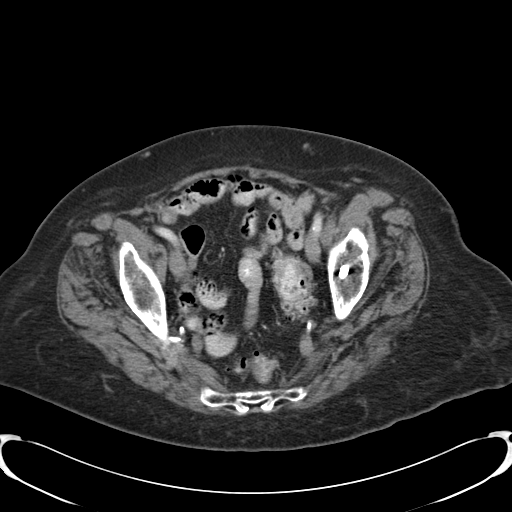
[im 40/85  soft-tissue]
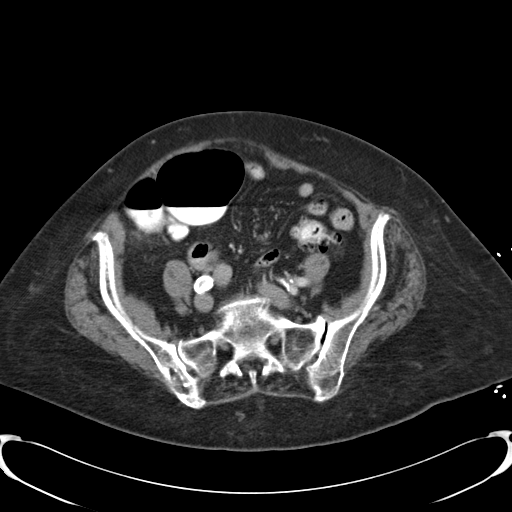
[im 45/85  soft-tissue]
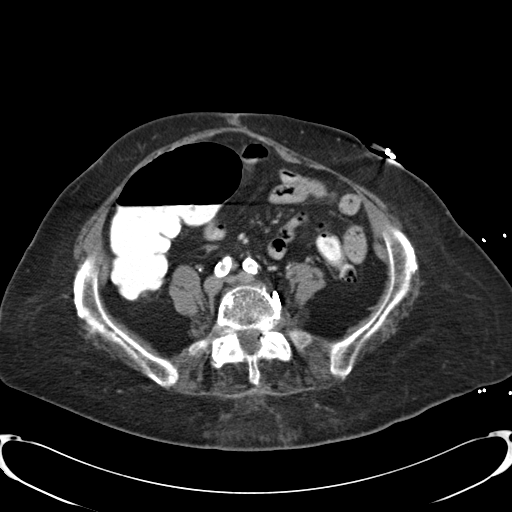
[im 54/85  soft-tissue]
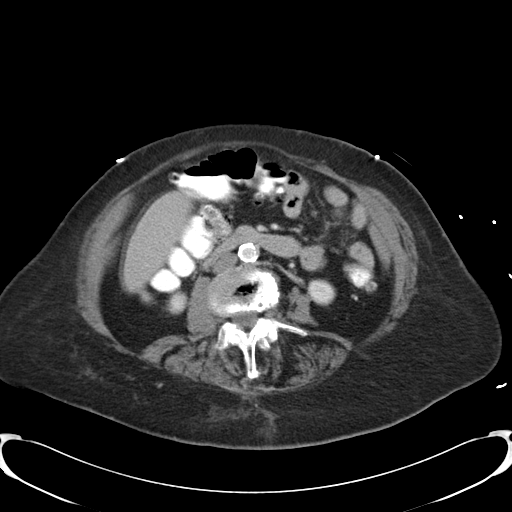
[im 58/85  soft-tissue]
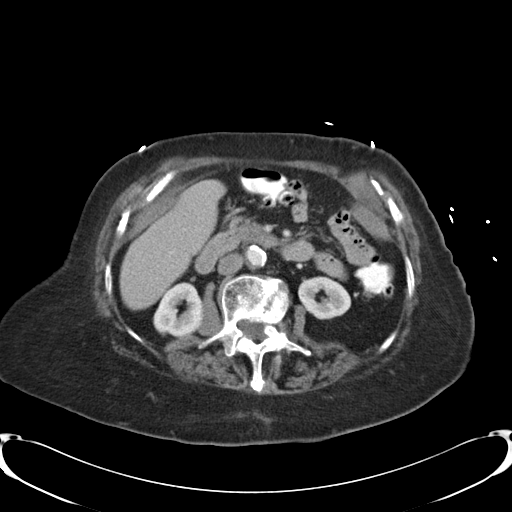
[im 58/85  bone]
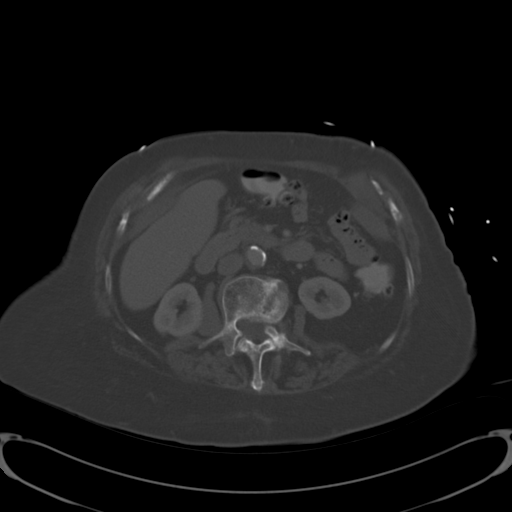
[im 67/85  soft-tissue]
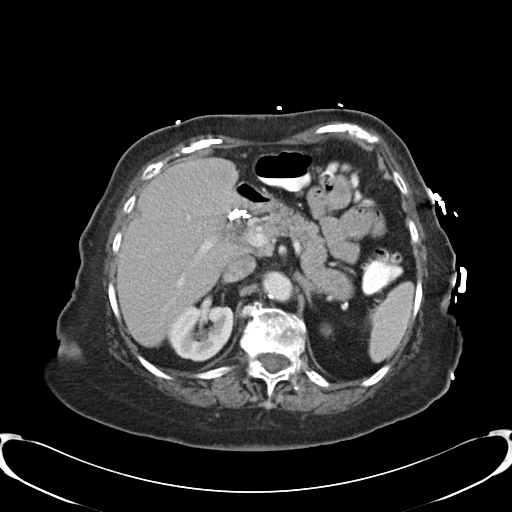
[im 71/85  soft-tissue]
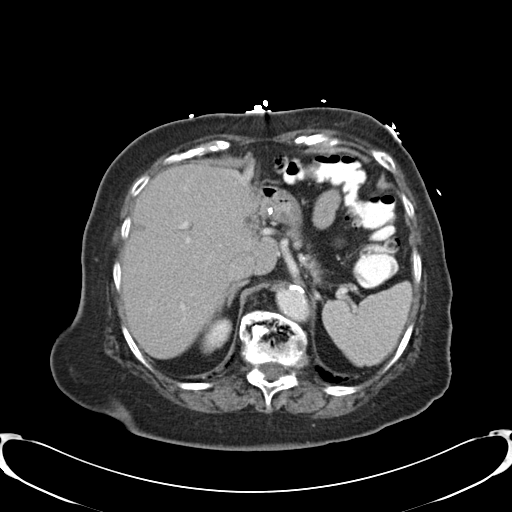
[im 80/85  soft-tissue]
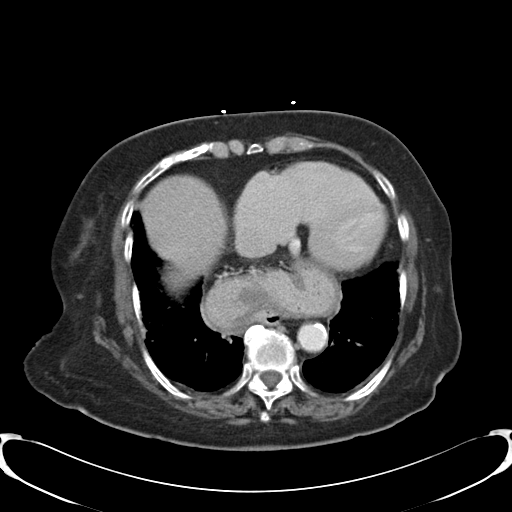

[Series 5: cor routine abd pel with · coronal · 0.71mm/px · 3 of 121 slices shown]
[im 41/121  soft-tissue]
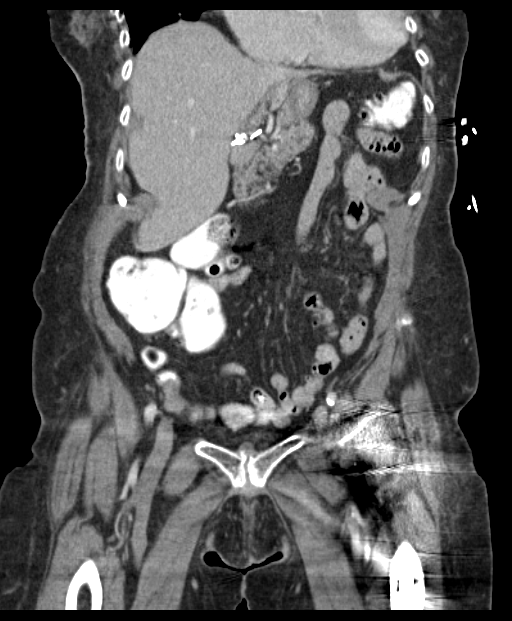
[im 54/121  soft-tissue]
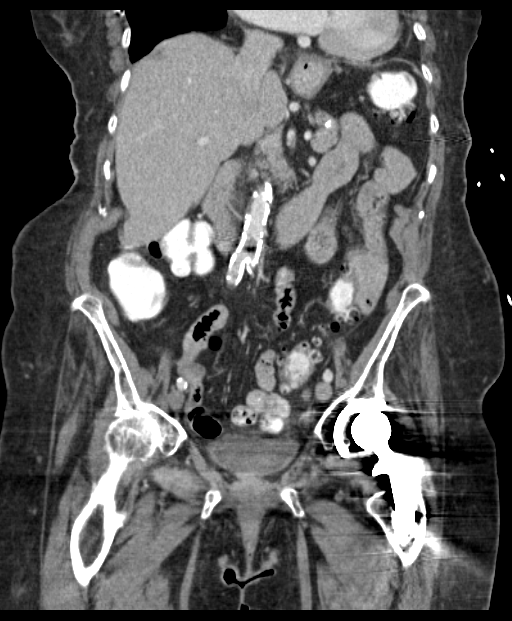
[im 67/121  soft-tissue]
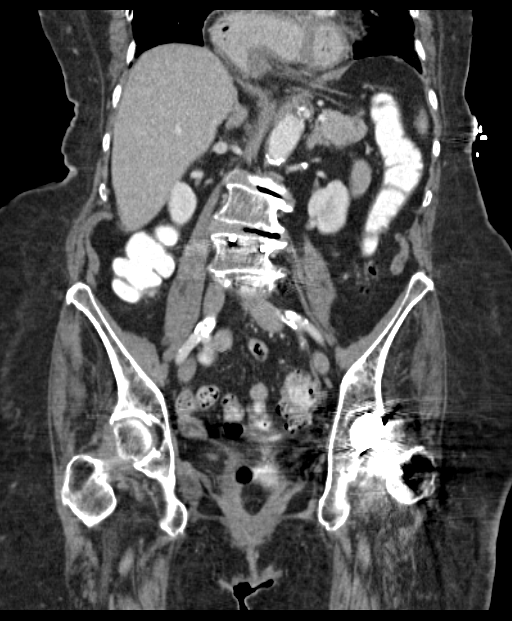

[15 of 46 positions shown; findings below may reference images not displayed]

FINDINGS: Lower chest: The included lung bases are clear. Moderate to large
hiatal hernia, greater than 50% of the stomach is intrathoracic.
Heart appears normal in size, displaced anteriorly secondary to
hiatal hernia. There are coronary artery calcifications.

Liver: No focal lesion.

Hepatobiliary: Clips in the gallbladder fossa postcholecystectomy.
No biliary dilatation.

Pancreas: Heterogeneous ill-defined decreased density in the region
of the pancreatic head. Unclear whether this represents fatty
atrophy or underlying focal lesion. No definite pancreatic ductal
dilatation. No peripancreatic inflammation.

Spleen: Normal.

Adrenal glands: No nodule.

Kidneys: Cortical scarring and atrophy of the upper left kidney.
Homogeneous enhancement throughout the right kidney. Simple cyst in
the medial right kidney.

Stomach/Bowel: Moderate to large hiatal hernia, greater than 50% of
the stomach is intrathoracic. No obstruction. Enteric contrast
reaches the colon. There are multiple colonic diverticula throughout
the entire colon, most prominent distally. No acute diverticulitis.
Appendix is not seen. Probable floppy cecum. Borderline pelvic floor
descent.

Vascular/Lymphatic: No retroperitoneal adenopathy. Abdominal aorta
is normal in caliber. Dense atherosclerosis of the abdominal aorta
and its branches.

Reproductive: The uterus is not seen.  No adnexal mass.

Bladder: Minimally distended and not well evaluated.

Other: No free air, free fluid, or intra-abdominal fluid collection.

Musculoskeletal: There are no acute or suspicious osseous
abnormalities. Left hip arthroplasty. Posterior fusion L4-L5.
Scoliosis with multilevel degenerative change throughout the lumbar
spine. Mild compression deformity of T12 appears chronic.
IMPRESSION: 1. Heterogeneous density of the pancreatic head, unclear whether
this represents fatty infiltration, atrophy, or underlying
pancreatic lesion. There is no peripancreatic inflammation or ductal
dilatation. MRCP could be considered for evaluation, only when
patient is able tolerate breath hold technique.
2. Moderate to large hiatal hernia, greater than 50% of the stomach
is intrathoracic. No volvulus or complication.
3. Diffuse colonic diverticulosis, no diverticulitis.
4. Scarring and atrophy of the upper left kidney.
